# Patient Record
Sex: Male | Born: 1975 | Race: White | Hispanic: No | Smoking: Former smoker
Health system: Southern US, Community
[De-identification: ages and names within clinical notes are randomized; demographics above are authoritative.]

## PROBLEM LIST (undated history)

## (undated) DIAGNOSIS — U071 COVID-19: Secondary | ICD-10-CM

## (undated) DIAGNOSIS — B019 Varicella without complication: Secondary | ICD-10-CM

## (undated) DIAGNOSIS — E079 Disorder of thyroid, unspecified: Secondary | ICD-10-CM

## (undated) DIAGNOSIS — N200 Calculus of kidney: Secondary | ICD-10-CM

## (undated) DIAGNOSIS — G43909 Migraine, unspecified, not intractable, without status migrainosus: Secondary | ICD-10-CM

## (undated) DIAGNOSIS — T7840XA Allergy, unspecified, initial encounter: Secondary | ICD-10-CM

## (undated) HISTORY — DX: Varicella without complication: B01.9

## (undated) HISTORY — DX: Calculus of kidney: N20.0

## (undated) HISTORY — DX: Allergy, unspecified, initial encounter: T78.40XA

## (undated) HISTORY — PX: FRACTURE SURGERY: SHX138

## (undated) HISTORY — PX: SPINE SURGERY: SHX786

## (undated) HISTORY — PX: OTHER SURGICAL HISTORY: SHX169

## (undated) HISTORY — DX: Migraine, unspecified, not intractable, without status migrainosus: G43.909

---

## 1997-12-22 ENCOUNTER — Ambulatory Visit (HOSPITAL_COMMUNITY): Admission: RE | Admit: 1997-12-22 | Discharge: 1997-12-22 | Payer: Self-pay | Admitting: Family Medicine

## 1998-02-22 ENCOUNTER — Encounter: Payer: Self-pay | Admitting: Emergency Medicine

## 1998-02-22 ENCOUNTER — Emergency Department (HOSPITAL_COMMUNITY): Admission: EM | Admit: 1998-02-22 | Discharge: 1998-02-23 | Payer: Self-pay | Admitting: Emergency Medicine

## 1998-02-23 ENCOUNTER — Encounter: Payer: Self-pay | Admitting: Emergency Medicine

## 1998-03-08 ENCOUNTER — Encounter: Payer: Self-pay | Admitting: Neurosurgery

## 1998-03-08 ENCOUNTER — Ambulatory Visit (HOSPITAL_COMMUNITY): Admission: RE | Admit: 1998-03-08 | Discharge: 1998-03-08 | Payer: Self-pay | Admitting: Neurosurgery

## 1999-09-20 ENCOUNTER — Emergency Department (HOSPITAL_COMMUNITY): Admission: EM | Admit: 1999-09-20 | Discharge: 1999-09-20 | Payer: Self-pay | Admitting: Emergency Medicine

## 1999-09-20 ENCOUNTER — Encounter: Payer: Self-pay | Admitting: Emergency Medicine

## 2000-07-18 ENCOUNTER — Emergency Department (HOSPITAL_COMMUNITY): Admission: EM | Admit: 2000-07-18 | Discharge: 2000-07-18 | Payer: Self-pay | Admitting: Emergency Medicine

## 2000-07-18 ENCOUNTER — Encounter: Payer: Self-pay | Admitting: Emergency Medicine

## 2001-04-23 ENCOUNTER — Emergency Department (HOSPITAL_COMMUNITY): Admission: EM | Admit: 2001-04-23 | Discharge: 2001-04-24 | Payer: Self-pay | Admitting: *Deleted

## 2001-04-25 ENCOUNTER — Emergency Department (HOSPITAL_COMMUNITY): Admission: EM | Admit: 2001-04-25 | Discharge: 2001-04-25 | Payer: Self-pay | Admitting: *Deleted

## 2006-05-25 ENCOUNTER — Emergency Department (HOSPITAL_COMMUNITY): Admission: EM | Admit: 2006-05-25 | Discharge: 2006-05-25 | Payer: Self-pay | Admitting: Emergency Medicine

## 2007-02-18 ENCOUNTER — Encounter: Admission: RE | Admit: 2007-02-18 | Discharge: 2007-02-18 | Payer: Self-pay | Admitting: Specialist

## 2007-04-19 ENCOUNTER — Ambulatory Visit: Payer: Self-pay | Admitting: *Deleted

## 2007-04-19 ENCOUNTER — Inpatient Hospital Stay (HOSPITAL_COMMUNITY): Admission: RE | Admit: 2007-04-19 | Discharge: 2007-04-21 | Payer: Self-pay | Admitting: Orthopedic Surgery

## 2007-04-20 ENCOUNTER — Encounter (INDEPENDENT_AMBULATORY_CARE_PROVIDER_SITE_OTHER): Payer: Self-pay | Admitting: Orthopedic Surgery

## 2007-05-27 HISTORY — PX: REPLACEMENT DISC ANTERIOR LUMBAR SPINE: SUR1215

## 2010-05-02 ENCOUNTER — Emergency Department (HOSPITAL_COMMUNITY): Admission: EM | Admit: 2010-05-02 | Discharge: 2010-01-03 | Payer: Self-pay | Admitting: Emergency Medicine

## 2010-10-08 NOTE — Op Note (Signed)
NAMESELDEN, NOTEBOOM              ACCOUNT NO.:  [id number]   MEDICAL RECORD NO.:  [id number]          PATIENT TYPE:  INP   LOCATION:  2899                         FACILITY:  [facility]   PHYSICIAN:  Alvy Beal, MD    DATE OF BIRTH:  1975-09-24   DATE OF PROCEDURE:  04/19/2007  DATE OF DISCHARGE:                               OPERATIVE REPORT   PREOPERATIVE DIAGNOSIS:  Degenerative disk disease, L5-S1, with  diskogenic back pain.   POSTOPERATIVE DIAGNOSIS:  Degenerative disk disease, L5-S1, with  diskogenic back pain.   OPERATIVE PROCEDURE:  Total disk replacement, L5-S1.   COMPLICATIONS:  None.   CONDITION:  Stable.   APPROACH SURGEON:  Liliane Bade, M.D.   FIRST ASSISTANT:  Verda Cumins   HISTORY:  This is a very pleasant 35 year old gentleman who was in a  usual state of health until he started developing progressive back pain.  He came to my attention and was diagnosed with degenerative disk  disease, L5-S1.  Long-term conservative management consisting of  physiotherapy, injection therapy, narcotic and anti-inflammatory  medications had failed to alleviate his symptoms.  Because of the  progressive nature of the debilitation and his functional loss, he  elected to proceed with surgery.  All appropriate risks, benefits and  alternatives were discussed with the patient, and consent was obtained.   OPERATIVE NOTE:  The patient was brought to the operating room and  placed supine on the operating table.  After successful induction of  general anesthesia and endotracheal intubation, TEDS, SCDs and Foley  were applied.  Roll was placed underneath the lumbar spine, and the  anterior abdomen was prepped and draped in standard fashion.  Dr. Madilyn Fireman  then did a right-sided retroperitoneal approach.  Please refer to his  dictation for specifics on the approach.   Once we had the vascular bundle mobilized and exposed the anterior  lumbar spine, I placed a needle into the  L5-S1 disk space and took an x-  ray confirming that this was the appropriate level.  Once confirmed, we  then proceeded with the diskectomy.   Using a 10-blade scalpel, I incised the annulus and then used a Cobb  curette to dissect the disk from the subchondral bone.  I then resected  the disk with a combination of pituitary rongeurs, curettes and Kerrison  rongeurs.  Once we had the disk material out, I was able to take a fine  curette and seek behind the body of S1 and release the annulus and  behind the body of L5 and released the posterior annulus.  I then took a  2-mm Kerrison rongeur and resected the posterior annulus.  At this  point, I had excellent decompression posteriorly.  I was able to get a  small curette out into the lateral aspect of the disk space, ensuring  that there was no undue pressure against the underlying exiting nerve  roots behind it.   At this point with the diskectomy complete and removing all fragments of  disk material, we then measured the interbody space and decided to use a  large size 10/11  degree lordosis prosthesis.  We confirmed its proper  position in the AP and lateral planes, ensuring it was centered.  Once  we were ensured of this, I then used a chisel to make fin cut on L5-S1.  With the fin properly cut, I then obtained the implant and mounted it  into position.  Once I confirmed I was at the adequate depth just at the  posterior vertebral body, I then placed the appropriate sized poly and  mounted it into position.  I removed the inserting apparatus and  confirmed that the poly was flush against the insert.  At this point, we  had satisfactory implantation.  I irrigated the wound copiously with  normal saline and then removed the self-retaining Thompson retractors.  Dr. Madilyn Fireman then scrubbed in at this point in time.  He inspected the  wound to ensure adequate hemostasis and then closed the rectus fascia  with interrupted #1 Vicryl sutures,  superficial with 2-0 Vicryl sutures  and 3-0 Monocryl for the skin.  Steri-Strips and dry dressing were  applied.  The patient was extubated and transferred to the PACU without  incident.  At the end of the case, all needle and sponge counts were  correct.      Alvy Beal, MD  Electronically Signed     DDB/MEDQ  D:  04/19/2007  T:  04/19/2007  Job:  [id number]   cc:   Alvy Beal, MD  P. Liliane Bade, M.D.

## 2010-10-08 NOTE — Op Note (Signed)
Curtis Cox, Curtis Cox              ACCOUNT NO.:  [id number]   MEDICAL RECORD NO.:  [id number]          PATIENT TYPE:  INP   LOCATION:  2550                         FACILITY:  [facility]   PHYSICIAN:  Balinda Quails, M.D.    DATE OF BIRTH:  15-Nov-1975   DATE OF PROCEDURE:  04/19/2007  DATE OF DISCHARGE:                               OPERATIVE REPORT   SURGEON:  Denman George, MD.   Mammie LorenzoDebria Garret D. Shon Baton, MD.   ANESTHETIC:  General endotracheal.   PREOPERATIVE DIAGNOSIS:  L5-S1 degenerative disk disease.   POSTOPERATIVE DIAGNOSIS:  L5-S1 degenerative disk disease.   PROCEDURE:  L5-S1 artificial disk (Prodisc).   CLINICAL NOTE:  Curtis Cox is a 35 year old male with chronic back  pain and degenerative disease at L5-S1.  Seen in consultation by Dr.  Shon Baton,  felt to be a good candidate for placement of a Prodisc. Brought  to the operating room at this time for planned procedure.  Consent  obtained.  The patient's lower extremity pulses normal.  The potential  risks of the operative procedure were explained to the patient with a  major morbidity/mortality of 1-2%.   DESCRIPTION OF PROCEDURE:  The patient was brought to the operating room  in stable condition.  Placed in supine position.  General endotracheal  anesthesia induced.  Pulse oximetry placed on the left foot.  In the  supine position, the abdomen prepped and draped in a sterile fashion.   A transverse skin incision made in the right lower quadrant from midline  to lateral margin of rectus.  Dissection carried down through the  subcutaneous tissue. The anterior rectus sheath incised from midline to  the lateral margin of right rectus muscle.  The superior and inferior  rectus sheath flaps were created.  The rectus muscle bluntly mobilized.  The muscle initially retracted medially. The retroperitoneal plane  entered.  There were some small rents made in the peritoneum, these were  not significant.  The  retroperitoneal space entered the psoas muscle and  genitofemoral nerve identified.  The nerve left on the muscle.   The ureter and sympathetic fibers were swept off of the right common and  external iliac artery.  The L5-S1 disk space was easily palpated.  This  was bluntly cleared initially.  The prefascial plane then incised with  Bovie cautery.  The L5-S1 disk exposed from right to left.  The lateral  margins of the L5 and S1 disk bodies were cleared.   Using a [employer] retractor with a reverse blade, the blades were  placed on the lateral margins of the vertebral bodies bilaterally.  This  fully expose the L5-S1 disk.  Malleable retractors were used to retract  inferiorly and superiorly.   Dr. Shon Baton then verified the level and completed the Prodisc procedure.   After completion of the Prodisc procedure, the retractor was removed.  There was no significant bleeding.  Sponges and instrument counts  correct.   The anterior rectus sheath closed with running #Vicryl suture.  The  subcutaneous tissue closed with running 3-0 Vicryl suture.  Skin closed  with 4-0 Monocryl.  Dermabond and Steri-Strips applied.  A sterile  dressing applied.  No apparent complications.  The patient was  transferred to the recovery room in stable condition.      Balinda Quails, M.D.  Electronically Signed     PGH/MEDQ  D:  04/19/2007  T:  04/19/2007  Job:  [id number]   cc:   Alvy Beal, MD

## 2010-10-11 NOTE — Consult Note (Signed)
Curtis Cox, CASTROGIOVANNI              ACCOUNT NO.:  [id number]   MEDICAL RECORD NO.:  [id number]          PATIENT TYPE:  INP   LOCATION:  5022                         FACILITY:  [facility]   PHYSICIAN:  Alvy Beal, MD    DATE OF BIRTH:  Apr 23, 1976   DATE OF CONSULTATION:  DATE OF DISCHARGE:  04/21/2007                                 CONSULTATION   REASON FOR ADMISSION:  Degenerative disk disease, lumbar five-sacral  one.   DISCHARGE DIAGNOSIS:  Degenerative disk disease, lumbar five-sacral one.   HISTORY:  This is a very pleasant 35 year old gentleman who has had  longstanding significant low back pain.  He was diagnosed with mild  degenerative disk disease at L5-S1 confirmed by MRI and discography.  Attempts at conservative management had failed to alleviate his symptoms  so he elected to proceed with a disk replacement.  All appropriate  risks, benefits, and alternatives were discussed with the patient.  Consent was obtained.  On the day of admission, he was admitted for the  disk replacement.  Surgery was uneventful.  There were no significant  adverse intraoperative occurrences.  Postoperatively he was admitted to  the floor.  He was ambulating, tolerating a regular diet and voiding  spontaneously.  Postoperative Doppler was unremarkable.  Postoperative  CT scan was satisfactory with no significant adverse positioning of the  hardware.  As a result, he was discharged on April 21, 2007,  postoperative day number 2, to home, with appropriate preprinted  instructions, pain medications and followup with me in approximately 2  weeks.      Alvy Beal, MD  Electronically Signed     DDB/MEDQ  D:  06/03/2007  T:  06/03/2007  Job:  [id number]

## 2011-03-04 LAB — TYPE AND SCREEN
ABO/RH(D): A POS
Antibody Screen: NEGATIVE

## 2011-03-04 LAB — BASIC METABOLIC PANEL
BUN: 12
CO2: 30
Chloride: 101
GFR calc Af Amer: >60
GFR calc non Af Amer: >60
Potassium: 4
Sodium: 135

## 2011-03-04 LAB — ABO/RH: ABO/RH(D): A POS

## 2011-03-04 LAB — CBC
HCT: 43.2
MCHC: 34.2
MCHC: 34.7
RDW: 13.7
WBC: 7.4
WBC: 9.8

## 2011-05-05 ENCOUNTER — Ambulatory Visit (INDEPENDENT_AMBULATORY_CARE_PROVIDER_SITE_OTHER): Payer: 59 | Admitting: Sports Medicine

## 2011-05-05 VITALS — BP 120/80 | Ht 68.0 in | Wt 165.0 lb

## 2011-05-05 DIAGNOSIS — M722 Plantar fascial fibromatosis: Secondary | ICD-10-CM

## 2011-05-05 NOTE — Progress Notes (Signed)
  Subjective:    Patient ID: Curtis Cox, male    DOB: Aug 26, 1975, 35 y.o.   MRN: [id number]  HPI 35 y/o male is here complaining of bilateral heel pain and arch pain x2 months, worse in the morning and eases off somewhat after walking around.  He does wear a rigid 3/4 insert which he purchased from the [employer] store 2 years ago when he had knee pain.  He no longer has knee pain but has developed this foot pain.  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     Review of Systems     Objective:   Physical Exam  Tender to palpation at the insertion of the plantar fascia bilaterally Arches are preserved Normal posterior tibialis function No achilles tenderness to palpation  1cm healing wound on the right lower leg, no signs of infection  Ultrasound: Left plantar fascia is normal at the insertion but has a 0.41 cm thickening just distal to the insertion.  At this point there is a small fluid collection and calcification noted.  The left plantar fascia measures 0.41 at the insertion.  The plantar fascia appears normal distally bilaterally.       Assessment & Plan:

## 2011-05-05 NOTE — Patient Instructions (Signed)
1. You have plantar fasciitis.  It will be helped by wearing your insoles and arch supports daily.    2. You should do your stretching once or twice a day.  3. Do your exercises daily  4. Use ice at the end of the day  5. Follow up with [place] in 2 months.

## 2011-05-07 DIAGNOSIS — M722 Plantar fascial fibromatosis: Secondary | ICD-10-CM | POA: Insufficient documentation

## 2011-05-07 NOTE — Assessment & Plan Note (Addendum)
He will wear sports insoles with scaphoid support and also an arch strap on the left foot.  He will do plantar fascia exercises and ice at the end of the day.  Re ck 6 wks

## 2016-02-12 ENCOUNTER — Ambulatory Visit (INDEPENDENT_AMBULATORY_CARE_PROVIDER_SITE_OTHER): Payer: 59 | Admitting: Family Medicine

## 2016-02-12 ENCOUNTER — Encounter: Payer: Self-pay | Admitting: Family Medicine

## 2016-02-12 VITALS — BP 112/88 | HR 69 | Temp 97.6°F | Ht 68.75 in | Wt 182.8 lb

## 2016-02-12 DIAGNOSIS — N200 Calculus of kidney: Secondary | ICD-10-CM

## 2016-02-12 DIAGNOSIS — R309 Painful micturition, unspecified: Secondary | ICD-10-CM | POA: Diagnosis not present

## 2016-02-12 DIAGNOSIS — M545 Low back pain, unspecified: Secondary | ICD-10-CM

## 2016-02-12 LAB — POC URINALSYSI DIPSTICK (AUTOMATED)
BILIRUBIN UA: NEGATIVE
Blood, UA: NEGATIVE
GLUCOSE UA: NEGATIVE
Ketones, UA: NEGATIVE
Leukocytes, UA: NEGATIVE
Nitrite, UA: NEGATIVE
Protein, UA: NEGATIVE
SPEC GRAV UA: 1.015
UROBILINOGEN UA: 0.2
pH, UA: 7

## 2016-02-12 MED ORDER — TAMSULOSIN HCL 0.4 MG PO CAPS: 0.4000 mg | ORAL_CAPSULE | Freq: Every day | ORAL | 0 refills | Status: DC

## 2016-02-12 MED ORDER — OXYCODONE-ACETAMINOPHEN 5-325 MG PO TABS: 1.0000 | ORAL_TABLET | ORAL | 0 refills | Status: DC | PRN

## 2016-02-12 MED ORDER — KETOROLAC TROMETHAMINE 60 MG/2ML IM SOLN
60.0000 mg | Freq: Once | INTRAMUSCULAR | Status: AC
  Administered 2016-02-12: 60 mg via INTRAMUSCULAR

## 2016-02-12 NOTE — Progress Notes (Signed)
Pre visit review using our clinic review tool, if applicable. No additional management support is needed unless otherwise documented below in the visit note. 

## 2016-02-12 NOTE — Progress Notes (Addendum)
Phone: [phone]  Subjective:  Patient presents today to establish care. Chief complaint-noted.   See problem oriented charting  The following were reviewed and entered/updated in epic: Past Medical History:  Diagnosis Date  . Chicken pox   . Kidney stones   . Migraines   . Nephrolithiasis    Patient Active Problem List   Diagnosis Date Noted  . Nephrolithiasis   . Plantar fasciitis 05/07/2011   Past Surgical History:  Procedure Laterality Date  . gamekeepers thumb surgery    . REPLACEMENT DISC ANTERIOR LUMBAR SPINE  2009   L5-S1    Family History  Problem Relation Age of Onset  . Healthy Mother   . Nephrolithiasis Father   . Healthy Sister   . Healthy Brother     Medications- reviewed and updated Current Outpatient Prescriptions  Medication Sig Dispense Refill  . oxyCODONE-acetaminophen (PERCOCET/ROXICET) 5-325 MG tablet Take 1 tablet by mouth every 4 (four) hours as needed for severe pain. 30 tablet 0  . tamsulosin (FLOMAX) 0.4 MG CAPS capsule Take 1 capsule (0.4 mg total) by mouth daily. 30 capsule 0   No current facility-administered medications for this visit.     Allergies-reviewed and updated Allergies  Allergen Reactions  . Penicillins     Social History   Social History  . Marital status: Married    Spouse name: N/A  . Number of children: N/A  . Years of education: N/A   Social History Main Topics  . Smoking status: Former Smoker    Packs/day: 0.75    Years: 20.00    Types: Cigarettes    Quit date: 05/27/2015  . Smokeless tobacco: Never Used  . Alcohol use Yes     Comment: 2-3 per week  . Drug use: Unknown  . Sexual activity: Not Asked   Other Topics Concern  . None   Social History Narrative   Married. 1 son 10 in 2017.       Works at [employer]. HS education    ROS--Full ROS was completed Review of Systems  Constitutional: Negative for chills and fever.  HENT: Negative for hearing loss.   Eyes: Negative for blurred  vision and double vision.  Respiratory: Negative for cough and hemoptysis.   Cardiovascular: Negative for chest pain and palpitations.  Gastrointestinal: Negative for abdominal pain, heartburn, nausea (mild) and vomiting.  Genitourinary: Positive for flank pain. Negative for dysuria, hematuria and urgency.  Musculoskeletal: Positive for myalgias (right flank pain). Negative for neck pain.  Skin: Negative for itching and rash.  Neurological: Negative for dizziness, tingling and headaches.  Endo/Heme/Allergies: Negative for polydipsia. Does not bruise/bleed easily.  Psychiatric/Behavioral: Negative for hallucinations and substance abuse. The patient is not nervous/anxious.     Objective: BP 112/88 (BP Location: Left Arm, Patient Position: Sitting, Cuff Size: Normal)   Pulse 69   Temp 97.6 F (36.4 C) (Oral)   Ht 5' 8.75" (1.746 m)   Wt 182 lb 12.8 oz (82.9 kg)   SpO2 97%   BMI 27.19 kg/m  Gen: NAD, resting comfortably HEENT: Mucous membranes are moist. Oropharynx normal. TM normal. Eyes: sclera and lids normal, PERRLA Neck: no thyromegaly, no cervical lymphadenopathy CV: RRR no murmurs rubs or gallops Lungs: CTAB no crackles, wheeze, rhonchi Abdomen: soft/nontender/nondistended/normal bowel sounds. No rebound or guarding.  MSK: patient tender to even light touch in right CVA- no tenderness on left Ext: no edema Skin: warm, dry Neuro: 5/5 strength in upper and lower extremities, normal gait, normal reflexes  Results  for orders placed or performed in visit on 02/12/16 (from the past 48 hour(s))  POCT Urinalysis Dipstick (Automated)     Status: None   Collection Time: 02/12/16 12:37 PM  Result Value Ref Range   Color, UA yellow    Clarity, UA clear    Glucose, UA n    Bilirubin, UA n    Ketones, UA n    Spec Grav, UA 1.015    Blood, UA n    pH, UA 7.0    Protein, UA n    Urobilinogen, UA 0.2    Nitrite, UA n    Leukocytes, UA Negative Negative     Assessment/Plan:  Urinary pain - Plan: POCT Urinalysis Dipstick (Automated) Right-sided low back pain without sciatica - Plan: ketorolac (TORADOL) injection 60 mg S: Patient with history of multiple kidney stones with epic noting stone 1-2 mm in 2001 and 2 mm in 2008 but he believes he has had several more. Has never seen urology as always passes the stone. Rates pain as 4-5/10 at lowest to waves of pain 8/10 like someone is squeezing his back. Has tried some ibuprofen and helps some. He has been going to work but is in serious discomfort. No radiatoin of pain to groin.  A/P: Very strong probability of right sided kidney stone- other stones have been small enough to pass on their own. UA unimpressive but does not rule out stone. We will give flomax. Given toradol 60mg  in office. Percocet to use as needed at home. We discussed if not having passed by Friday or Monday or worsening symptoms to get CT stone study  Physical later this year- as needed for current issue  Orders Placed This Encounter  Procedures  . POCT Urinalysis Dipstick (Automated)    Meds ordered this encounter  Medications  . ketorolac (TORADOL) injection 60 mg  . tamsulosin (FLOMAX) 0.4 MG CAPS capsule    Sig: Take 1 capsule (0.4 mg total) by mouth daily.    Dispense:  30 capsule    Refill:  0  . oxyCODONE-acetaminophen (PERCOCET/ROXICET) 5-325 MG tablet    Sig: Take 1 tablet by mouth every 4 (four) hours as needed for severe pain.    Dispense:  30 tablet    Refill:  0    Return precautions advised.  Garret Reddish, MD

## 2016-02-12 NOTE — Patient Instructions (Signed)
Starting tonight schedule aleve every 12 hours to give you a baseline level of pain control  Percocet as needed for pain control above that  Start flomax to help you pass stone- strain urine so we can tell if passing  If stone has not passed by Friday or Monday- we will get CT scan- can message [place] or call or contact Roselyn Reef

## 2016-02-13 ENCOUNTER — Encounter: Payer: Self-pay | Admitting: Family Medicine

## 2016-02-13 DIAGNOSIS — N200 Calculus of kidney: Secondary | ICD-10-CM | POA: Insufficient documentation

## 2016-02-15 ENCOUNTER — Ambulatory Visit (INDEPENDENT_AMBULATORY_CARE_PROVIDER_SITE_OTHER)
Admission: RE | Admit: 2016-02-15 | Discharge: 2016-02-15 | Disposition: A | Discharge status: Home / Self Care | Payer: 59 | Source: Ambulatory Visit | Attending: Family Medicine | Admitting: Family Medicine

## 2016-02-15 ENCOUNTER — Ambulatory Visit (INDEPENDENT_AMBULATORY_CARE_PROVIDER_SITE_OTHER): Payer: 59 | Admitting: *Deleted

## 2016-02-15 ENCOUNTER — Telehealth: Payer: Self-pay | Admitting: Family Medicine

## 2016-02-15 DIAGNOSIS — N200 Calculus of kidney: Secondary | ICD-10-CM | POA: Diagnosis not present

## 2016-02-15 MED ORDER — KETOROLAC TROMETHAMINE 60 MG/2ML IM SOLN
60.0000 mg | Freq: Once | INTRAMUSCULAR | Status: AC
  Administered 2016-02-15: 60 mg via INTRAMUSCULAR

## 2016-02-15 NOTE — Telephone Encounter (Signed)
Spoke to pt, told him order for scan was done and someone will be contacting him to schedule appt. Pt verbalized understanding. Also asked pt if he wants to see Dr.Hunter or just wants another injection? Pt said just injection. Told pt okay can come in at 11:45 for injection only. Pt verbalized understanding.

## 2016-02-15 NOTE — Telephone Encounter (Signed)
Patient wants to set up CT scan and get injection with continued pain from stone. Ordered scan. He also may want toradol shot- Butch Penny- can you see if he wants to see me or just get on injectoin schedule? Toradol 60mg  IM  Would like to get scan done today. ordered

## 2016-02-18 ENCOUNTER — Ambulatory Visit (INDEPENDENT_AMBULATORY_CARE_PROVIDER_SITE_OTHER): Payer: 59 | Admitting: Family Medicine

## 2016-02-18 ENCOUNTER — Encounter: Payer: Self-pay | Admitting: Family Medicine

## 2016-02-18 VITALS — BP 128/102 | HR 78 | Temp 97.9°F | Wt 184.8 lb

## 2016-02-18 DIAGNOSIS — M545 Low back pain, unspecified: Secondary | ICD-10-CM

## 2016-02-18 DIAGNOSIS — R5383 Other fatigue: Secondary | ICD-10-CM | POA: Diagnosis not present

## 2016-02-18 DIAGNOSIS — R6883 Chills (without fever): Secondary | ICD-10-CM | POA: Diagnosis not present

## 2016-02-18 LAB — COMPREHENSIVE METABOLIC PANEL
ALT: 25 U/L (ref 0–53)
AST: 28 U/L (ref 0–37)
Albumin: 4.6 g/dL (ref 3.5–5.2)
Alkaline Phosphatase: 56 U/L (ref 39–117)
BILIRUBIN TOTAL: 0.5 mg/dL (ref 0.2–1.2)
BUN: 17 mg/dL (ref 6–23)
CALCIUM: 9.4 mg/dL (ref 8.4–10.5)
CHLORIDE: 104 meq/L (ref 96–112)
CO2: 28 meq/L (ref 19–32)
CREATININE: 1.29 mg/dL (ref 0.40–1.50)
GFR: 65.22 mL/min (ref >60.00)
GLUCOSE: 69 mg/dL — AB (ref 70–99)
Potassium: 3.9 mEq/L (ref 3.5–5.1)
Sodium: 141 mEq/L (ref 135–145)
Total Protein: 8.2 g/dL (ref 6.0–8.3)

## 2016-02-18 LAB — CBC WITH DIFFERENTIAL/PLATELET
BASOS ABS: 0 10*3/uL (ref 0.0–0.1)
BASOS PCT: 0.5 % (ref 0.0–3.0)
EOS ABS: 0.1 10*3/uL (ref 0.0–0.7)
Eosinophils Relative: 1.7 % (ref 0.0–5.0)
HEMATOCRIT: 44.3 % (ref 39.0–52.0)
Hemoglobin: 15.4 g/dL (ref 13.0–17.0)
LYMPHS ABS: 1.7 10*3/uL (ref 0.7–4.0)
Lymphocytes Relative: 25.9 % (ref 12.0–46.0)
MCHC: 34.7 g/dL (ref 30.0–36.0)
MCV: 86.2 fl (ref 78.0–100.0)
Monocytes Absolute: 0.4 10*3/uL (ref 0.1–1.0)
Monocytes Relative: 6.8 % (ref 3.0–12.0)
NEUTROS ABS: 4.3 10*3/uL (ref 1.4–7.7)
NEUTROS PCT: 65.1 % (ref 43.0–77.0)
PLATELETS: 345 10*3/uL (ref 150.0–400.0)
RBC: 5.13 Mil/uL (ref 4.22–5.81)
RDW: 13 % (ref 11.5–15.5)
WBC: 6.6 10*3/uL (ref 4.0–10.5)

## 2016-02-18 LAB — POC URINALSYSI DIPSTICK (AUTOMATED)
Bilirubin, UA: NEGATIVE
Blood, UA: NEGATIVE
GLUCOSE UA: NEGATIVE
Ketones, UA: NEGATIVE
LEUKOCYTES UA: NEGATIVE
Nitrite, UA: NEGATIVE
PROTEIN UA: NEGATIVE
SPEC GRAV UA: 1.01
UROBILINOGEN UA: 0.2
pH, UA: 5.5

## 2016-02-18 LAB — TSH: TSH: 49.25 u[IU]/mL — ABNORMAL HIGH (ref 0.35–4.50)

## 2016-02-18 NOTE — Progress Notes (Signed)
Pre visit review using our clinic review tool, if applicable. No additional management support is needed unless otherwise documented below in the visit note. 

## 2016-02-18 NOTE — Patient Instructions (Addendum)
Labs before you go.   I suspect this is a virus and wouldn't be surprised if you develop fever, more diffuse body aches, cough, congestion  I would go home- get lots of rest, stay hydrated, tylenol as needed for the body and back aches. If your symptoms worsen- I want you to come back in ASAP. Let me know if you neck stiffness or fevers as well  Stop flomax

## 2016-02-18 NOTE — Progress Notes (Signed)
Subjective:  Curtis Cox is a 40 y.o. year old very pleasant male patient who presents for/with See problem oriented charting ROS- no measured fever. Mild headache at times. No nausea or vomiting. No abdominal pain. No dysuria or polyuria or penile discharge .see any ROS included in HPI as well.   Past Medical History-  Patient Active Problem List   Diagnosis Date Noted  . Nephrolithiasis   . Plantar fasciitis 05/07/2011    Medications- reviewed and updated Current Outpatient Prescriptions  Medication Sig Dispense Refill  . oxyCODONE-acetaminophen (PERCOCET/ROXICET) 5-325 MG tablet Take 1 tablet by mouth every 4 (four) hours as needed for severe pain. 30 tablet 0   No current facility-administered medications for this visit.     Objective: BP (!) 128/102 (BP Location: Left Arm, Patient Position: Sitting, Cuff Size: Normal)   Pulse 78   Temp 97.9 F (36.6 C) (Oral)   Wt 184 lb 12.8 oz (83.8 kg)   SpO2 98%   BMI 27.49 kg/m  Gen: NAD, appears very fatigued. Wants to just lay down after exam TM normal, nares normal, oropharynx normal CV: RRR no murmurs rubs or gallops Lungs: CTAB no crackles, wheeze, rhonchi Abdomen: soft/nontender/nondistended/normal bowel sounds. No rebound or guarding.  Ext: no edema Skin: warm, dry Neuro: grossly normal, moves all extremities  Assessment/Plan:  Other fatigue - Plan: CBC with Differential/Platelet, Comprehensive metabolic panel, TSH Chills - Plan: CBC with Differential/Platelet, Comprehensive metabolic panel Bilateral low back pain without sciatica - Plan: POCT Urinalysis Dipstick (Automated), Urine culture  S: Patient was treated last week for potential kidney stones. CT did not show stones but patient could have been having ureteral spasms after passage of stone (as reported pain even after CT scan and given toradol shot). He states after that point he has had minimal right CVA tenderness and previously up to 8/10 reports as 1/10.  But since Saturday he has felt very fatigued. He has periods of feeling very childled and going back and forth between hot and cold. He has a 1/10 ache in bilateral low back which he describes as kidneys aching- he is worried about potential infection A/P: 40 year old male with fatigue,chills and bilateral low back pain of unknown etiology- after CT noncontrast did not show cause for prior heavy level pain- may have passed stone.   Flu swab today negative. No nuchal rigidity doubt meningitis. Suspect this could be nonspecific viral illness. No dysuria or polyuria- did not do rectal as doubt prostatitis as cause. Considered referral back to ortho with prior back surgery but will hold off for now as back pain not primary issue. Strict return precautions given. Will get UA and culture but doubt that is the cause. Also get cbc and cmp.  Has already stopped flomax as of Friday. No recent tick bites.   Orders Placed This Encounter  Procedures  . Urine culture    [facility]  . CBC with Differential/Platelet  . Comprehensive metabolic panel    [place]  . TSH    [employer]  . POCT Urinalysis Dipstick (Automated)   Return precautions advised.  Garret Reddish, MD

## 2016-02-19 ENCOUNTER — Other Ambulatory Visit: Payer: Self-pay

## 2016-02-19 DIAGNOSIS — R7989 Other specified abnormal findings of blood chemistry: Secondary | ICD-10-CM

## 2016-02-20 ENCOUNTER — Other Ambulatory Visit (INDEPENDENT_AMBULATORY_CARE_PROVIDER_SITE_OTHER): Payer: 59

## 2016-02-20 DIAGNOSIS — R946 Abnormal results of thyroid function studies: Secondary | ICD-10-CM | POA: Diagnosis not present

## 2016-02-20 DIAGNOSIS — R7989 Other specified abnormal findings of blood chemistry: Secondary | ICD-10-CM

## 2016-02-20 LAB — TSH: TSH: 45.96 u[IU]/mL — ABNORMAL HIGH (ref 0.35–4.50)

## 2016-02-20 LAB — URINE CULTURE: ORGANISM ID, BACTERIA: NO GROWTH

## 2016-02-20 LAB — T3, FREE: T3 FREE: 3.4 pg/mL (ref 2.3–4.2)

## 2016-02-20 LAB — T4, FREE: Free T4: 0.43 ng/dL — ABNORMAL LOW (ref 0.60–1.60)

## 2016-02-21 ENCOUNTER — Other Ambulatory Visit: Payer: Self-pay

## 2016-02-21 DIAGNOSIS — R7989 Other specified abnormal findings of blood chemistry: Secondary | ICD-10-CM

## 2016-02-21 MED ORDER — LEVOTHYROXINE SODIUM 88 MCG PO TABS: 88.0000 ug | ORAL_TABLET | Freq: Every day | ORAL | 5 refills | Status: DC

## 2016-03-27 ENCOUNTER — Telehealth: Payer: Self-pay

## 2016-03-27 ENCOUNTER — Other Ambulatory Visit: Payer: Self-pay

## 2016-03-27 DIAGNOSIS — R7989 Other specified abnormal findings of blood chemistry: Secondary | ICD-10-CM

## 2016-03-27 NOTE — Telephone Encounter (Signed)
Patient called asking to have levels for thyroid checked. Patient is experiencing mood swings and cold insensitivity. Patient was originally scheduled to have levels checked in December.  Please advise

## 2016-03-27 NOTE — Telephone Encounter (Signed)
Yes thanks- TSH ok to check at this time- supect may need higher dose

## 2016-03-27 NOTE — Telephone Encounter (Signed)
Spoke with patient. Scheduled for a lab appointment tomorrow and order entered.

## 2016-03-28 ENCOUNTER — Other Ambulatory Visit (INDEPENDENT_AMBULATORY_CARE_PROVIDER_SITE_OTHER): Payer: 59

## 2016-03-28 ENCOUNTER — Other Ambulatory Visit: Payer: Self-pay | Admitting: Family Medicine

## 2016-03-28 DIAGNOSIS — R7989 Other specified abnormal findings of blood chemistry: Secondary | ICD-10-CM

## 2016-03-28 DIAGNOSIS — R946 Abnormal results of thyroid function studies: Secondary | ICD-10-CM

## 2016-03-28 LAB — T4, FREE: Free T4: 0.72 ng/dL (ref 0.60–1.60)

## 2016-03-28 LAB — T3, FREE: T3, Free: 4.5 pg/mL — ABNORMAL HIGH (ref 2.3–4.2)

## 2016-03-28 LAB — TSH: TSH: 7.08 u[IU]/mL — ABNORMAL HIGH (ref 0.35–4.50)

## 2016-03-28 MED ORDER — LEVOTHYROXINE SODIUM 100 MCG PO TABS: 100.0000 ug | ORAL_TABLET | Freq: Every day | ORAL | 5 refills | Status: DC

## 2016-04-04 ENCOUNTER — Other Ambulatory Visit: Payer: 59

## 2016-05-02 ENCOUNTER — Other Ambulatory Visit (INDEPENDENT_AMBULATORY_CARE_PROVIDER_SITE_OTHER): Payer: 59

## 2016-05-02 DIAGNOSIS — R946 Abnormal results of thyroid function studies: Secondary | ICD-10-CM

## 2016-05-02 DIAGNOSIS — R7989 Other specified abnormal findings of blood chemistry: Secondary | ICD-10-CM

## 2016-05-02 LAB — TSH: TSH: 3.94 u[IU]/mL (ref 0.35–4.50)

## 2016-05-08 ENCOUNTER — Encounter: Payer: Self-pay | Admitting: Family Medicine

## 2016-05-08 ENCOUNTER — Ambulatory Visit (INDEPENDENT_AMBULATORY_CARE_PROVIDER_SITE_OTHER): Payer: 59 | Admitting: Family Medicine

## 2016-05-08 VITALS — BP 92/68 | HR 75 | Temp 97.9°F | Ht 68.5 in | Wt 181.8 lb

## 2016-05-08 DIAGNOSIS — E039 Hypothyroidism, unspecified: Secondary | ICD-10-CM | POA: Diagnosis not present

## 2016-05-08 DIAGNOSIS — Z23 Encounter for immunization: Secondary | ICD-10-CM | POA: Diagnosis not present

## 2016-05-08 DIAGNOSIS — Z7251 High risk heterosexual behavior: Secondary | ICD-10-CM | POA: Diagnosis not present

## 2016-05-08 DIAGNOSIS — Z0000 Encounter for general adult medical examination without abnormal findings: Secondary | ICD-10-CM | POA: Diagnosis not present

## 2016-05-08 DIAGNOSIS — Z114 Encounter for screening for human immunodeficiency virus [HIV]: Secondary | ICD-10-CM

## 2016-05-08 LAB — LIPID PANEL
Cholesterol: 195 mg/dL (ref 0–200)
HDL: 40 mg/dL (ref >39.00)
NonHDL: 155.46
Total CHOL/HDL Ratio: 5
Triglycerides: 203 mg/dL — ABNORMAL HIGH (ref 0.0–149.0)
VLDL: 40.6 mg/dL — ABNORMAL HIGH (ref 0.0–40.0)

## 2016-05-08 LAB — POC URINALSYSI DIPSTICK (AUTOMATED)
BILIRUBIN UA: NEGATIVE
Blood, UA: NEGATIVE
GLUCOSE UA: NEGATIVE
Ketones, UA: NEGATIVE
LEUKOCYTES UA: NEGATIVE
NITRITE UA: NEGATIVE
Spec Grav, UA: 1.025
Urobilinogen, UA: 0.2
pH, UA: 5

## 2016-05-08 LAB — COMPREHENSIVE METABOLIC PANEL
ALBUMIN: 5.2 g/dL (ref 3.5–5.2)
ALK PHOS: 66 U/L (ref 39–117)
ALT: 20 U/L (ref 0–53)
AST: 24 U/L (ref 0–37)
BUN: 20 mg/dL (ref 6–23)
CO2: 27 mEq/L (ref 19–32)
Calcium: 9.9 mg/dL (ref 8.4–10.5)
Chloride: 105 mEq/L (ref 96–112)
Creatinine, Ser: 1.15 mg/dL (ref 0.40–1.50)
GFR: 74.38 mL/min (ref >60.00)
GLUCOSE: 81 mg/dL (ref 70–99)
Potassium: 4.2 mEq/L (ref 3.5–5.1)
Sodium: 140 mEq/L (ref 135–145)
TOTAL PROTEIN: 7.8 g/dL (ref 6.0–8.3)
Total Bilirubin: 0.6 mg/dL (ref 0.2–1.2)

## 2016-05-08 LAB — TSH: TSH: 1.47 u[IU]/mL (ref 0.35–4.50)

## 2016-05-08 LAB — LDL CHOLESTEROL, DIRECT: Direct LDL: 121 mg/dL

## 2016-05-08 NOTE — Progress Notes (Signed)
Pre visit review using our clinic review tool, if applicable. No additional management support is needed unless otherwise documented below in the visit note. 

## 2016-05-08 NOTE — Assessment & Plan Note (Signed)
Now controlled on Levothyroxine 125mcg. Initial tsh 50

## 2016-05-08 NOTE — Progress Notes (Signed)
Phone: [phone]  Subjective:  Patient presents today for their annual physical. Chief complaint-noted.   See problem oriented charting- ROS- full  review of systems was completed and negative except for: mild fatigue but continuing to improve. Has some chronic low back pain  The following were reviewed and entered/updated in epic: Past Medical History:  Diagnosis Date  . Chicken pox   . Kidney stones   . Migraines   . Nephrolithiasis    Patient Active Problem List   Diagnosis Date Noted  . Hypothyroidism 05/08/2016    Priority: Medium  . Nephrolithiasis     Priority: Low  . Plantar fasciitis 05/07/2011    Priority: Low   Past Surgical History:  Procedure Laterality Date  . gamekeepers thumb surgery    . REPLACEMENT DISC ANTERIOR LUMBAR SPINE  2009   L5-S1    Family History  Problem Relation Age of Onset  . Healthy Mother   . Nephrolithiasis Father   . Healthy Sister   . Healthy Brother     Medications- reviewed and updated Current Outpatient Prescriptions  Medication Sig Dispense Refill  . levothyroxine (SYNTHROID, LEVOTHROID) 100 MCG tablet Take 1 tablet (100 mcg total) by mouth daily before breakfast. 30 tablet 5  . oxyCODONE-acetaminophen (PERCOCET/ROXICET) 5-325 MG tablet- on hand for kidney stone Take 1 tablet by mouth every 4 (four) hours as needed for severe pain. 30 tablet 0   Allergies-reviewed and updated Allergies  Allergen Reactions  . Penicillins     Social History   Social History  . Marital status: Married    Spouse name: N/A  . Number of children: N/A  . Years of education: N/A   Social History Main Topics  . Smoking status: Former Smoker    Packs/day: 0.75    Years: 20.00    Types: Cigarettes    Quit date: 05/27/2015  . Smokeless tobacco: Never Used  . Alcohol use Yes     Comment: 2-3 per week  . Drug use: Unknown  . Sexual activity: Not Asked   Other Topics Concern  . None   Social History Narrative   Married. 1 son 10  in 2017.       Works at [employer]. HS education   Prior in [place].     Objective: BP 92/68 (BP Location: Left Arm, Patient Position: Sitting, Cuff Size: Large)   Pulse 75   Temp 97.9 F (36.6 C) (Oral)   Ht 5' 8.5" (1.74 m)   Wt 181 lb 12.8 oz (82.5 kg)   SpO2 94%   BMI 27.24 kg/m  Gen: NAD, resting comfortably HEENT: Mucous membranes are moist. Oropharynx normal Neck: no thyromegaly CV: RRR no murmurs rubs or gallops Lungs: CTAB no crackles, wheeze, rhonchi Abdomen: soft/nontender/nondistended/normal bowel sounds. No rebound or guarding.  Ext: no edema Skin: warm, dry Neuro: grossly normal, moves all extremities, PERRLA  Assessment/Plan:  40 y.o. male presenting for annual physical.  Health Maintenance counseling: 1. Anticipatory guidance: Patient counseled regarding regular dental exams, eye exams (no issues), wearing seatbelts.  2. Risk factor reduction:  Advised patient of need for regular exercise and diet rich and fruits and vegetables to reduce risk of heart attack and stroke. Hypothyroidism made it difficult but recently he has done some workouts at night with son. Goal 150 minutes a week. 165 in 2012- work towards this goal Wt Readings from Last 3 Encounters:  05/08/16 181 lb 12.8 oz (82.5 kg)  02/18/16 184 lb 12.8 oz (83.8 kg)  02/12/16  182 lb 12.8 oz (82.9 kg)  3. Immunizations/screenings/ancillary studies- offered and given TDAP. Offered HIV screen and will do Immunization History  Administered Date(s) Administered  . Influenza-Unspecified 03/15/2016  4. Prostate cancer screening- no family history, start at age 49   5. Colon cancer screening - no family history, start at age 44 6. Skin cancer prevention- advised regular sunscreen use  7. Testicular cancer screening- advised monthly self exams  8. Monogamous in married relationship- declines std testing other than HIV  Status of chronic or acute concerns   Hypothyroidism Now controlled on Levothyroxine  163mcg. Initial tsh 50  1 year for physical. We could also do a TSH check in 6 months if you would like (could be lab only visit)   Orders Placed This Encounter  Procedures  . CBC with Differential/Platelet  . Comprehensive metabolic panel    [place]    Order Specific Question:   Has the patient fasted?    Answer:   No  . Lipid panel    [place]    Order Specific Question:   Has the patient fasted?    Answer:   No  . TSH    [place]  . HIV antibody  . POCT Urinalysis Dipstick (Automated)   Return precautions advised.   Garret Reddish, MD

## 2016-05-08 NOTE — Patient Instructions (Addendum)
Tdap today  1 year for physical. We could also do a TSH check in 6 months if you would like (could be lab only visit)

## 2016-05-09 LAB — CBC WITH DIFFERENTIAL/PLATELET
BASOS ABS: 0 10*3/uL (ref 0.0–0.1)
Basophils Relative: 0.5 % (ref 0.0–3.0)
Eosinophils Absolute: 0.1 10*3/uL (ref 0.0–0.7)
Eosinophils Relative: 1.6 % (ref 0.0–5.0)
HCT: 44.5 % (ref 39.0–52.0)
HEMOGLOBIN: 15.3 g/dL (ref 13.0–17.0)
LYMPHS ABS: 1.5 10*3/uL (ref 0.7–4.0)
LYMPHS PCT: 28.5 % (ref 12.0–46.0)
MCHC: 34.4 g/dL (ref 30.0–36.0)
MCV: 85.8 fl (ref 78.0–100.0)
MONOS PCT: 9.7 % (ref 3.0–12.0)
Monocytes Absolute: 0.5 10*3/uL (ref 0.1–1.0)
NEUTROS PCT: 59.7 % (ref 43.0–77.0)
Neutro Abs: 3.1 10*3/uL (ref 1.4–7.7)
Platelets: 344 10*3/uL (ref 150.0–400.0)
RBC: 5.18 Mil/uL (ref 4.22–5.81)
RDW: 14 % (ref 11.5–15.5)
WBC: 5.2 10*3/uL (ref 4.0–10.5)

## 2016-05-09 LAB — HIV ANTIBODY (ROUTINE TESTING W REFLEX): HIV: NONREACTIVE

## 2016-08-01 ENCOUNTER — Other Ambulatory Visit: Payer: Self-pay | Admitting: Family Medicine

## 2016-08-01 DIAGNOSIS — R7989 Other specified abnormal findings of blood chemistry: Secondary | ICD-10-CM

## 2016-08-01 MED ORDER — LEVOTHYROXINE SODIUM 100 MCG PO TABS: 100.0000 ug | ORAL_TABLET | Freq: Every day | ORAL | 2 refills | Status: DC

## 2016-08-01 NOTE — Telephone Encounter (Signed)
Received a fax from the pharmacy requesting a 90 day supply.  Sent in on 03/28/16 for 6 months but 30 day supply.  Last TSH level on 05/08/16 and normal.  Sent to the pharmacy by e-scribe for 90 days and 2 additional refills (9 months).

## 2016-11-06 ENCOUNTER — Encounter: Payer: Self-pay | Admitting: Family Medicine

## 2016-11-06 ENCOUNTER — Ambulatory Visit (INDEPENDENT_AMBULATORY_CARE_PROVIDER_SITE_OTHER): Payer: 59 | Admitting: Family Medicine

## 2016-11-06 VITALS — BP 106/78 | HR 101 | Temp 98.3°F | Ht 68.5 in | Wt 182.2 lb

## 2016-11-06 DIAGNOSIS — J0100 Acute maxillary sinusitis, unspecified: Secondary | ICD-10-CM

## 2016-11-06 DIAGNOSIS — E039 Hypothyroidism, unspecified: Secondary | ICD-10-CM

## 2016-11-06 MED ORDER — PREDNISONE 20 MG PO TABS: ORAL_TABLET | ORAL | 0 refills | Status: DC

## 2016-11-06 NOTE — Patient Instructions (Addendum)
Please stop by lab before you go  I want you out of work through Monday to rest up as much as possible- make sure to stay well hydrated with water  Sinsusitis Viral based on <10 days, no double sickening, lack of severity of symptoms in first 3 days. Educated on signs that bacterial infection may have developed (symptoms over 10 days, double sickening).   Treatment: -considered steroid: we opted in- prednisone for 7 days -other symptomatic care with mucinex -Antibiotic indicated: not at present- would be if gets better then gets worse or if symptoms last over 10 days  Also will check thyroid as he felt poorly like this when first diagnosed with underactive thyroid  Finally, we reviewed reasons to return to care including if symptoms worsen or persist or new concerns arise (particularly fever or shortness of breath)  Meds ordered this encounter  Medications  . predniSONE (DELTASONE) 20 MG tablet    Sig: Take 2 pills for 3 days, 1 pill for 4 days    Dispense:  10 tablet    Refill:  0

## 2016-11-06 NOTE — Progress Notes (Signed)
PCP: Marin Olp, MD  Subjective:  Curtis Cox is a 41 y.o. year old very pleasant male patient who presents with sinusitis symptoms including nasal congestion, sinus tenderness mainly maxillary. Yellow/green nasal discharge.  -other symptoms include: feels run down, weak, very cold. IN fact spent an hour laying down in his shower in fetal position earlier today. States felt like this when diagnosed with hypothyroidism but has been compliant with meds 116mcg of levothyroxicine Lab Results  Component Value Date   TSH 1.47 05/08/2016  -day of illness:2 -Symptoms are improving very slightly -previous treatments: Nasal decongestant helped some -sick contacts/travel/risks: denies flu exposure.  -Hx of: allergies  ROS-denies fever, SOB, NVD, tooth pain  Pertinent Past Medical History-  Patient Active Problem List   Diagnosis Date Noted  . Hypothyroidism 05/08/2016    Priority: Medium  . Nephrolithiasis     Priority: Low  . Plantar fasciitis 05/07/2011    Priority: Low   Medications- reviewed  Current Outpatient Prescriptions  Medication Sig Dispense Refill  . levothyroxine (SYNTHROID, LEVOTHROID) 100 MCG tablet Take 1 tablet (100 mcg total) by mouth daily before breakfast. 90 tablet 2  . predniSONE (DELTASONE) 20 MG tablet Take 2 pills for 3 days, 1 pill for 4 days 10 tablet 0   No current facility-administered medications for this visit.     Objective: BP 106/78 (BP Location: Left Arm, Patient Position: Sitting, Cuff Size: Large)   Pulse (!) 101   Temp 98.3 F (36.8 C) (Oral)   Ht 5' 8.5" (1.74 m)   Wt 182 lb 3.2 oz (82.6 kg)   SpO2 97%   BMI 27.30 kg/m  Gen: NAD, resting comfortably HEENT: Turbinates erythematous with yellow drainage, TM normal, pharynx mildly erythematous with no tonsilar exudate or edema, left maxillary sinus tenderness CV: RRR no murmurs rubs or gallops Lungs: CTAB no crackles, wheeze, rhonchi Abdomen: soft/nontender/nondistended/normal  bowel sounds. No rebound or guarding.  Ext: no edema Skin: warm, dry, no rash Neuro: grossly normal, moves all extremities  Assessment/Plan:  Sinsusitis Viral based on <10 days, no double sickening, lack of severity of symptoms in first 3 days. Educated on signs that bacterial infection may have developed (symptoms over 10 days, double sickening).   Treatment: -considered steroid: we opted in- prednisone for 7 days -other symptomatic care with mucinex -Antibiotic indicated: not at present- would be if gets better then gets worse or if symptoms last over 10 days  Also will check thyroid as he felt poorly like this when first diagnosed with underactive thyroid. High HR noted- more strongly suspect infectious process than severe hypothyroidism causing his fatigue especially with more acute onset of symptoms.   Finally, we reviewed reasons to return to care including if symptoms worsen or persist or new concerns arise (particularly fever or shortness of breath)  Meds ordered this encounter  Medications  . predniSONE (DELTASONE) 20 MG tablet    Sig: Take 2 pills for 3 days, 1 pill for 4 days    Dispense:  10 tablet    Refill:  0    Garret Reddish, MD

## 2016-11-07 LAB — TSH: TSH: 3.58 u[IU]/mL (ref 0.35–4.50)

## 2016-11-27 ENCOUNTER — Ambulatory Visit (INDEPENDENT_AMBULATORY_CARE_PROVIDER_SITE_OTHER): Payer: 59 | Admitting: Family Medicine

## 2016-11-27 ENCOUNTER — Encounter: Payer: Self-pay | Admitting: Family Medicine

## 2016-11-27 VITALS — BP 98/72 | HR 91 | Temp 98.1°F | Ht 68.5 in | Wt 182.5 lb

## 2016-11-27 DIAGNOSIS — J029 Acute pharyngitis, unspecified: Secondary | ICD-10-CM

## 2016-11-27 DIAGNOSIS — R0981 Nasal congestion: Secondary | ICD-10-CM | POA: Diagnosis not present

## 2016-11-27 DIAGNOSIS — H6983 Other specified disorders of Eustachian tube, bilateral: Secondary | ICD-10-CM

## 2016-11-27 DIAGNOSIS — R590 Localized enlarged lymph nodes: Secondary | ICD-10-CM

## 2016-11-27 LAB — POCT RAPID STREP A (OFFICE): RAPID STREP A SCREEN: NEGATIVE

## 2016-11-27 NOTE — Progress Notes (Signed)
HPI:  Acute visit for throat pain: -reports intermittent hoarseness and drainage for months - has chronic sinus issues -for last few days has more pain in throat, nodes of throat and feels thyroid is swollen and hurts to swallow -denies:fever, SOB, NVD, tooth pain, dysphagia, wheezing -has tried: nothing -sick contacts/travel/risks: no reported flu, strep or tick exposure -Hx of: allergies -intermittent prior smoking hx -he is very worried that he has a mass in his neck  ROS: See pertinent positives and negatives per HPI.  Past Medical History:  Diagnosis Date  . Chicken pox   . Kidney stones   . Migraines   . Nephrolithiasis     Past Surgical History:  Procedure Laterality Date  . gamekeepers thumb surgery    . REPLACEMENT DISC ANTERIOR LUMBAR SPINE  2009   L5-S1    Family History  Problem Relation Age of Onset  . Healthy Mother   . Nephrolithiasis Father   . Healthy Sister   . Healthy Brother     Social History   Social History  . Marital status: Married    Spouse name: N/A  . Number of children: N/A  . Years of education: N/A   Social History Main Topics  . Smoking status: Former Smoker    Packs/day: 0.75    Years: 20.00    Types: Cigarettes    Quit date: 05/27/2015  . Smokeless tobacco: Never Used  . Alcohol use Yes     Comment: 2-3 per week  . Drug use: Unknown  . Sexual activity: Not Asked   Other Topics Concern  . None   Social History Narrative   Married. 1 son 10 in 2017.       Works at [employer]. HS education   Prior in [place].      Current Outpatient Prescriptions:  .  levothyroxine (SYNTHROID, LEVOTHROID) 100 MCG tablet, Take 1 tablet (100 mcg total) by mouth daily before breakfast., Disp: 90 tablet, Rfl: 2  EXAM:  Vitals:   11/27/16 1415  BP: 98/72  Pulse: 91  Temp: 98.1 F (36.7 C)    Body mass index is 27.35 kg/m.  GENERAL: vitals reviewed and listed above, alert, oriented, appears well hydrated and in no acute  distress  HEENT: atraumatic, conjunttiva clear, no obvious abnormalities on inspection of external nose and ears, normal appearance of ear canals and TMs, clear nasal congestion, mild post oropharyngeal erythema with PND, no tonsillar edema or exudate, no sinus TTP  NECK: no obvious masses on inspection except for mild bilat tender ant cervical lymphadenopathy  LUNGS: clear to auscultation bilaterally, no wheezes, rales or rhonchi, good air movement  CV: HRRR, no peripheral edema  MS: moves all extremities without noticeable abnormality  PSYCH: pleasant and cooperative, no obvious depression or anxiety  ASSESSMENT AND PLAN:  Discussed the following assessment and plan:  Sore throat - Plan: US Soft Tissue Head/Neck, POC Rapid Strep A  Sinus congestion  LAD (lymphadenopathy), anterior cervical - Plan: US Soft Tissue Head/Neck  Dysfunction of both eustachian tubes  -we discussed possible serious and likely etiologies, workup and treatment, treatment risks and return precautions - suspect VURI vs allergic rhinitis with mild reactive LAD vs other -after this discussion, Aman opted for strep testing,  US neck as he if very anxious about a mass, treatment for the allergies, close follow up with PCP -did advise will possibly need ENT eval if persistent issues  -follow up advised in 1 week -of course, we advised Leory Plowman  to return or notify a doctor immediately if symptoms worsen or persist or new concerns arise.   Patient Instructions  BEFORE YOU LEAVE: -strep testing -details of [place] -follow up: 1 week with PCP  Flonase 2 sprays each nostril daily  Zyrtec nightly  Get the ultrasound as planned  I hope you are feeling better soon! Seek care immediately if worsening, new concerns or you are not improving with treatment.     Colin Benton R., DO

## 2016-11-27 NOTE — Patient Instructions (Signed)
BEFORE YOU LEAVE: -strep testing -details of [place] -follow up: 1 week with PCP  Flonase 2 sprays each nostril daily  Zyrtec nightly  Get the ultrasound as planned  I hope you are feeling better soon! Seek care immediately if worsening, new concerns or you are not improving with treatment.

## 2016-11-28 ENCOUNTER — Ambulatory Visit
Admission: RE | Admit: 2016-11-28 | Discharge: 2016-11-28 | Disposition: A | Discharge status: Home / Self Care | Payer: 59 | Source: Ambulatory Visit | Attending: Family Medicine | Admitting: Family Medicine

## 2016-11-28 DIAGNOSIS — J029 Acute pharyngitis, unspecified: Secondary | ICD-10-CM

## 2016-11-28 DIAGNOSIS — R590 Localized enlarged lymph nodes: Secondary | ICD-10-CM

## 2016-12-04 ENCOUNTER — Ambulatory Visit (INDEPENDENT_AMBULATORY_CARE_PROVIDER_SITE_OTHER): Payer: 59 | Admitting: Family Medicine

## 2016-12-04 ENCOUNTER — Encounter: Payer: Self-pay | Admitting: Family Medicine

## 2016-12-04 VITALS — BP 90/70 | HR 90 | Temp 98.0°F | Ht 68.5 in | Wt 183.0 lb

## 2016-12-04 DIAGNOSIS — J029 Acute pharyngitis, unspecified: Secondary | ICD-10-CM | POA: Diagnosis not present

## 2016-12-04 DIAGNOSIS — R1310 Dysphagia, unspecified: Secondary | ICD-10-CM | POA: Diagnosis not present

## 2016-12-04 NOTE — Progress Notes (Signed)
Subjective:  Curtis Cox is a 41 y.o. year old very pleasant male patient who presents for/with See problem oriented charting ROS- no fever, chills, nausea, vomiting, unintentional weight loss   Past Medical History-  Patient Active Problem List   Diagnosis Date Noted  . Hypothyroidism 05/08/2016    Priority: Medium  . Nephrolithiasis     Priority: Low  . Plantar fasciitis 05/07/2011    Priority: Low    Medications- reviewed and updated Current Outpatient Prescriptions  Medication Sig Dispense Refill  . levothyroxine (SYNTHROID, LEVOTHROID) 100 MCG tablet Take 1 tablet (100 mcg total) by mouth daily before breakfast. 90 tablet 2   No current facility-administered medications for this visit.     Objective: BP 90/70 (BP Location: Left Arm, Patient Position: Sitting, Cuff Size: Large)   Pulse 90   Temp 98 F (36.7 C) (Oral)   Ht 5' 8.5" (1.74 m)   Wt 183 lb (83 kg)   SpO2 96%   BMI 27.42 kg/m  Gen: NAD, resting comfortably Oropharynx largely normal. Thyroid normal, neck without lymphadenopathy. Some pain with palpation to left and below cricoid cartilage CV: RRR no murmurs rubs or gallops Lungs: CTAB no crackles, wheeze, rhonchi Abdomen: soft/nontender/nondistended/normal bowel sounds. No rebound or guarding.  Ext: no edema Skin: warm, dry, no rash over throat  Assessment/Plan:  Sore throat - Plan: Ambulatory referral to ENT Odynophagia - Plan: Ambulatory referral to ENT S: thought back and throatactually had been hurting for 5-6 months and thought sinus/allergy drainage. Over last few weeks, Got more severe with swallowing- was hurting constantly but worse with swallowing. Presented to Dr. Maudie Mercury last week and thought potentially allergic. Taking zyrtec nightly. Did not start flonase daily but did start nasocort a few times. No fever or chills. A month ago treated with prednisone for maxillary sinusitis and at least had improvement in fatigue- pain resolved in his  sinuses as well with no significant drainage recently.   After last visit had neck/thyroid ultrasound which was reassuring/normal  Intermittent hoarseness and a few times completely lost his voice. Still having trouble with swallowing- has pain up to 3-4/10. Constant 1-2/10. Does not feel like food or liquid getting stuck but did feel that slightly before last visit.  A/P:  41 year old male with at least 2 weeks of more significant odynophagia without any concrete cause on exam. Slightly better with allergy treatment but not substantial improvement. We agreed to ENT referral to further evaluate. Has seen Dr. Thornell Mule in past so will try to get him plugged back in.  Patient Instructions  We will call you within a week or two about your referral to Dr. Thornell Mule. If you do not hear by next week, give [place] a call.   Trial aleve one pill twice a day for the next 5 days. Try to eat something at least small with this.    Orders Placed This Encounter  Procedures  . Ambulatory referral to ENT    Referral Priority:   Routine    Referral Type:   Consultation    Referral Reason:   Specialty Services Required    Requested Specialty:   Otolaryngology    Number of Visits Requested:   1   The duration of face-to-face time during this visit was greater than 15 minutes. Greater than 50% of this time was spent in counseling, explanation of diagnosis, planning of further management, and/or coordination of care including comforting over patient concerns of sore throat and developing follow up  plan. .     Return precautions advised.  Garret Reddish, MD

## 2016-12-04 NOTE — Patient Instructions (Signed)
We will call you within a week or two about your referral to Dr. Thornell Mule. If you do not hear by next week, give [place] a call.   Trial aleve one pill twice a day for the next 5 days. Try to eat something at least small with this.

## 2016-12-29 ENCOUNTER — Ambulatory Visit (HOSPITAL_COMMUNITY)
Admission: EM | Admit: 2016-12-29 | Discharge: 2016-12-29 | Disposition: A | Discharge status: Home / Self Care | Payer: 59 | Attending: Internal Medicine | Admitting: Internal Medicine

## 2016-12-29 ENCOUNTER — Encounter (HOSPITAL_COMMUNITY): Payer: Self-pay | Admitting: Emergency Medicine

## 2016-12-29 DIAGNOSIS — S0181XA Laceration without foreign body of other part of head, initial encounter: Secondary | ICD-10-CM

## 2016-12-29 HISTORY — DX: Disorder of thyroid, unspecified: E07.9

## 2016-12-29 MED ORDER — LIDOCAINE-EPINEPHRINE-TETRACAINE (LET) SOLUTION
NASAL | Status: AC
  Filled 2016-12-29: qty 3

## 2016-12-29 MED ORDER — LIDOCAINE-EPINEPHRINE-TETRACAINE (LET) SOLUTION
3.0000 mL | Freq: Once | NASAL | Status: AC
  Administered 2016-12-29: 17:00:00 3 mL via TOPICAL

## 2016-12-29 NOTE — ED Provider Notes (Signed)
[facility]    CSN: [id number] Arrival date & time: 12/29/16  1449     History   Chief Complaint Chief Complaint  Patient presents with  . Laceration    HPI Curtis Cox is a 41 y.o. male. He presents today with a laceration below the lower lip, he was using a socket wrench and the socket broke, and the handle he was holding under force came up and struck him in the chin. He has a 1 inch laceration in the skin fold just beneath the lower lip. It is not through and through. His teeth feel like they are in the right place to him, and he was not knocked down. No headache. Was able to walk into the urgent care independently. Denies other injury. Had a tetanus shot in December 2017.    HPI  Past Medical History:  Diagnosis Date  . Chicken pox   . Kidney stones   . Migraines   . Nephrolithiasis   . Thyroid disease     Patient Active Problem List   Diagnosis Date Noted  . Hypothyroidism 05/08/2016  . Nephrolithiasis   . Plantar fasciitis 05/07/2011    Past Surgical History:  Procedure Laterality Date  . gamekeepers thumb surgery    . REPLACEMENT DISC ANTERIOR LUMBAR SPINE  2009   L5-S1       Home Medications    Prior to Admission medications   Medication Sig Start Date End Date Taking? Authorizing Provider  levothyroxine (SYNTHROID, LEVOTHROID) 100 MCG tablet Take 1 tablet (100 mcg total) by mouth daily before breakfast. 08/01/16   Marin Olp, MD    Family History Family History  Problem Relation Age of Onset  . Healthy Mother   . Nephrolithiasis Father   . Healthy Sister   . Healthy Brother     Social History Social History  Substance Use Topics  . Smoking status: Former Smoker    Packs/day: 0.75    Years: 20.00    Types: Cigarettes    Quit date: 05/27/2015  . Smokeless tobacco: Never Used  . Alcohol use Yes     Comment: 2-3 per week     Allergies   Penicillins   Review of Systems Review of Systems  All other systems  reviewed and are negative.    Physical Exam Triage Vital Signs ED Triage Vitals  Enc Vitals Group     BP 12/29/16 1602 117/84     Pulse Rate 12/29/16 1602 93     Resp 12/29/16 1602 16     Temp 12/29/16 1602 98.2 F (36.8 C)     Temp src --      SpO2 12/29/16 1602 99 %     Weight --      Height --      Pain Score 12/29/16 1600 1     Pain Loc --    Updated Vital Signs BP 117/84 (BP Location: Right Arm)   Pulse 93   Temp 98.2 F (36.8 C)   Resp 16   SpO2 99%   Physical Exam  Constitutional: He is oriented to person, place, and time. No distress.  Alert, nicely groomed  HENT:  Head: Atraumatic.  1 inch laceration below the lower lip that is deep, but does not extend through to the mucosal surface. Located in the skin fold of the chin, and is not gaping. No hemotympanum, no nasal septal hematoma, no blood in the nose. No apparent injury to lips or tongue. No  focal swelling or bruising of the rest of the face or head.  Eyes: Pupils are equal, round, and reactive to light. EOM are normal.  Conjugate gaze, no eye redness/drainage  Neck: Neck supple.  Cardiovascular: Normal rate.   Pulmonary/Chest: No respiratory distress.  Abdominal: He exhibits no distension.  Musculoskeletal: Normal range of motion.  Neurological: He is alert and oriented to person, place, and time.  Face is symmetric, speech is clear/coherent Patient was able to walk into the urgent care independently, and climb on/off the exam table  Skin: Skin is warm and dry.  No cyanosis  Nursing note and vitals reviewed.    UC Treatments / Results   Procedures Procedures (including critical care time)  Medications Ordered in UC Medications  lidocaine-EPINEPHrine-tetracaine (LET) solution (3 mLs Topical Given 12/29/16 1647)   Wound washed carefully with wound cleaner and saline and patted dry.  Closed wound with tissue adhesive two applications, with good result.  Final Clinical Impressions(s) / UC Diagnoses    Final diagnoses:  Facial laceration, initial encounter   Keep wound dry for the next 3-5 days.  Do not use antibiotic ointment or any greasy preparation on the wound.  Recheck for any increasing redness/swelling/drainage/pain at site or new fever >100.5.  Anticipate glue will begin flaking off gradually over the next several days.  Controlled Substance Prescriptions [place] Controlled Substance Registry consulted? Not Applicable   Sherlene Shams, MD 12/31/16 2138

## 2016-12-29 NOTE — Discharge Instructions (Addendum)
Keep wound dry for the next 3-5 days.  Do not use antibiotic ointment or any greasy preparation on the wound.  Recheck for any increasing redness/swelling/drainage/pain at site or new fever >100.5.  Anticipate glue will begin flaking off gradually over the next several days.

## 2016-12-29 NOTE — ED Triage Notes (Signed)
Laceration to chin, cut does not go all the way through.  Patient was working on a car, a tool broke and popped self in the face.

## 2017-05-20 ENCOUNTER — Other Ambulatory Visit: Payer: Self-pay

## 2017-05-20 DIAGNOSIS — R7989 Other specified abnormal findings of blood chemistry: Secondary | ICD-10-CM

## 2017-05-20 MED ORDER — LEVOTHYROXINE SODIUM 100 MCG PO TABS: 100.0000 ug | ORAL_TABLET | Freq: Every day | ORAL | 2 refills | Status: DC

## 2017-11-23 IMAGING — US US SOFT TISSUE HEAD/NECK
1 series · 14 of 25 positions shown · non-contrast
Comparison: None.

CLINICAL DATA: Sore throat, sinus drainage, swollen thyroid,
anterior cervical lymphadenopathy

EXAM:
THYROID ULTRASOUND
TECHNIQUE: Ultrasound examination of the thyroid gland and adjacent soft
tissues was performed.

[Series 1: us soft tissue head/neck · 0.06mm/px · 14 of 41 slices shown]
[im 1/41]
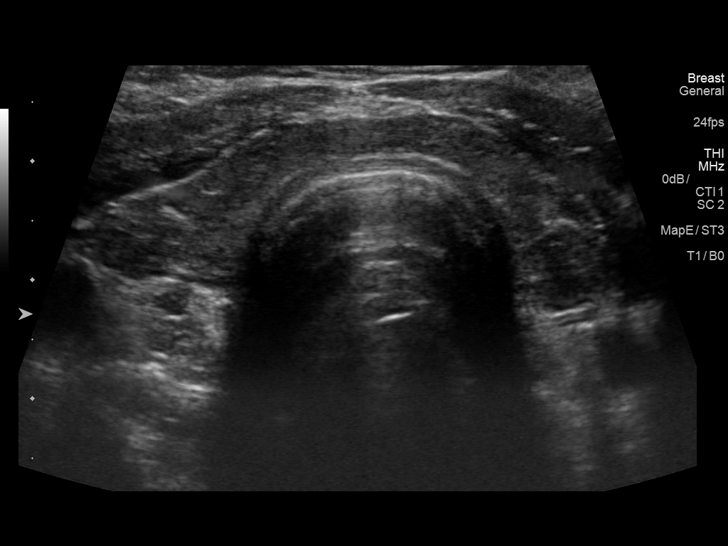
[im 4/41]
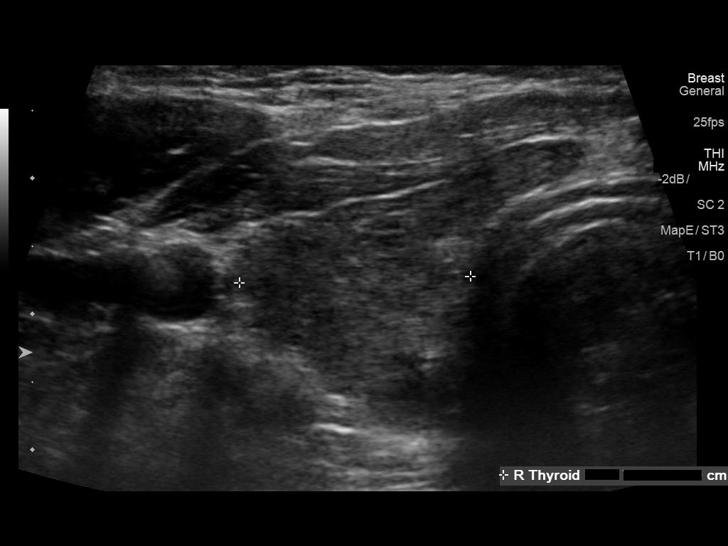
[im 7/41]
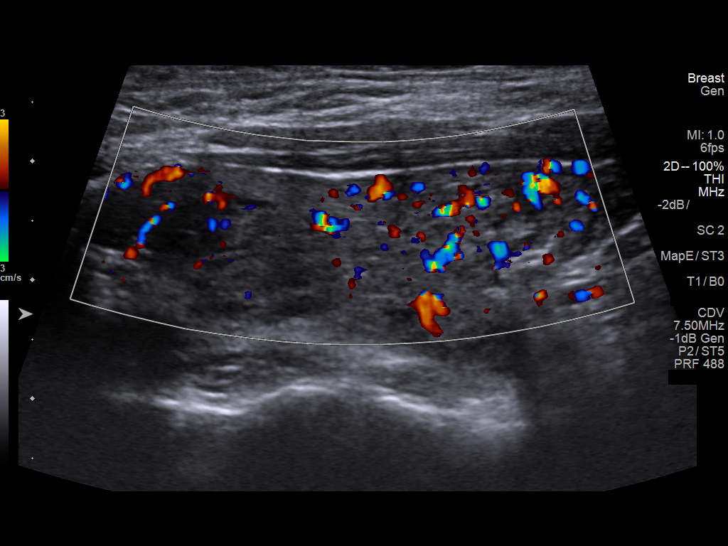
[im 11/41]
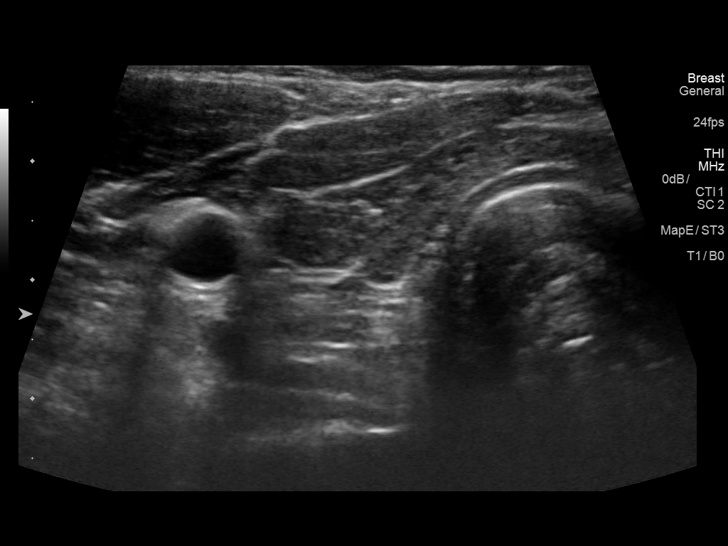
[im 14/41]
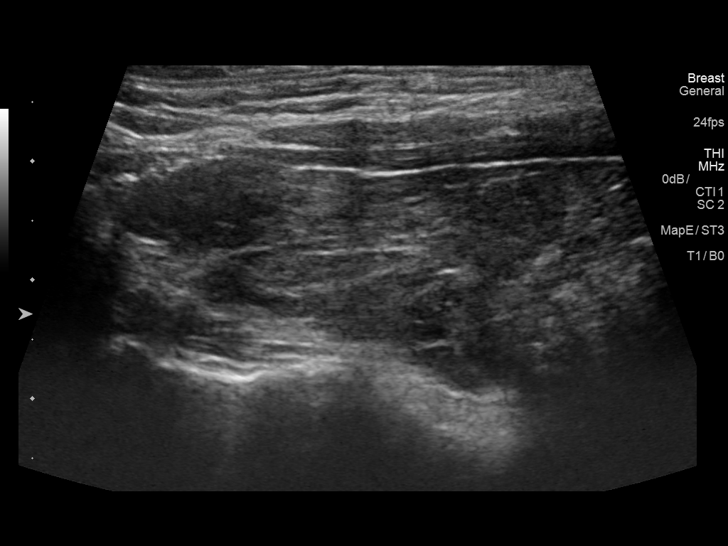
[im 16/41]
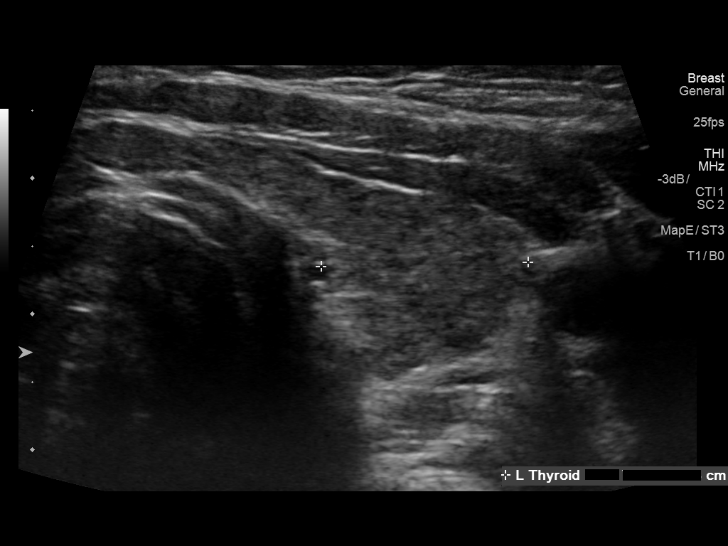
[im 19/41]
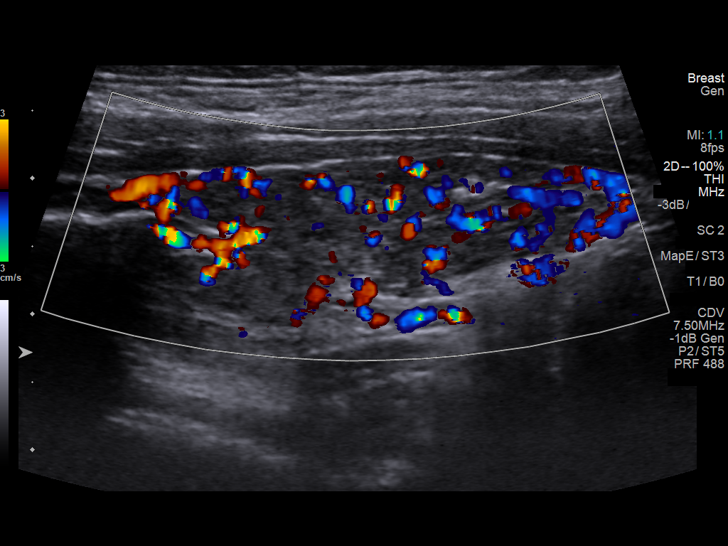
[im 22/41]
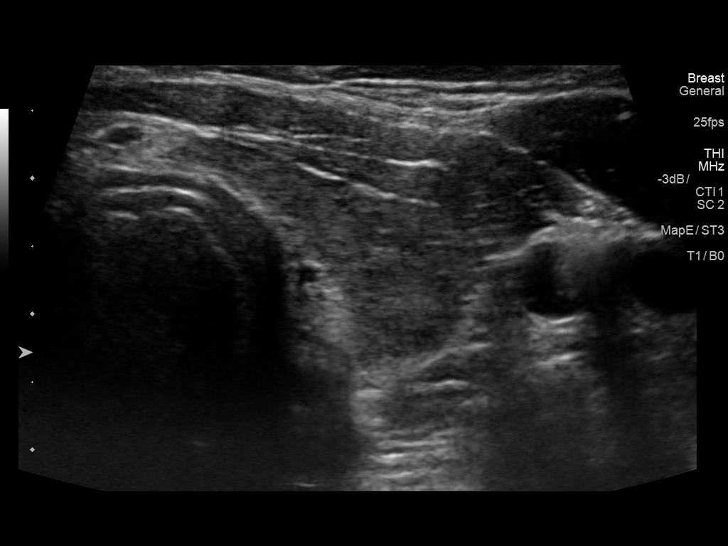
[im 26/41]
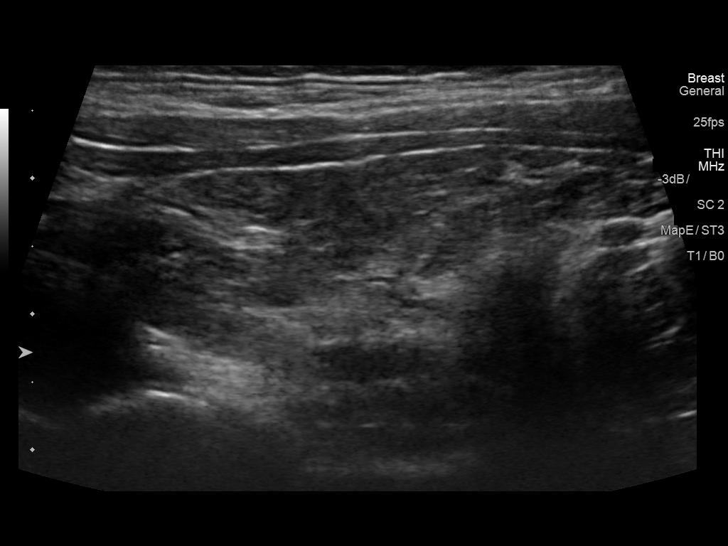
[im 27/41]
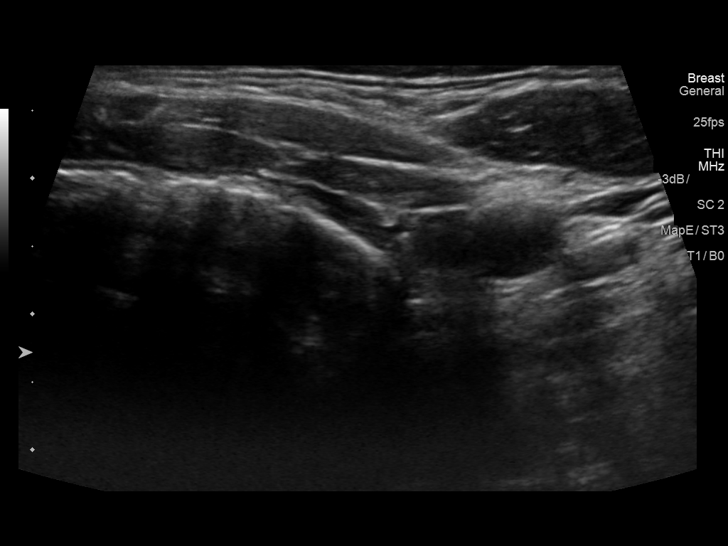
[im 31/41]
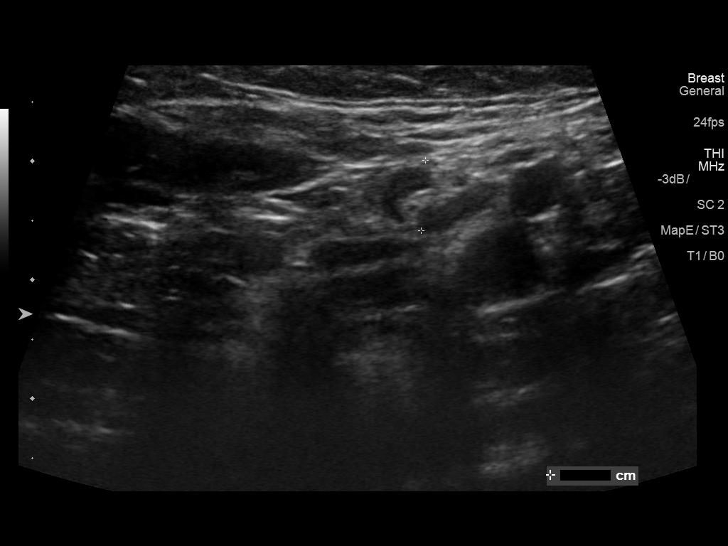
[im 34/41]
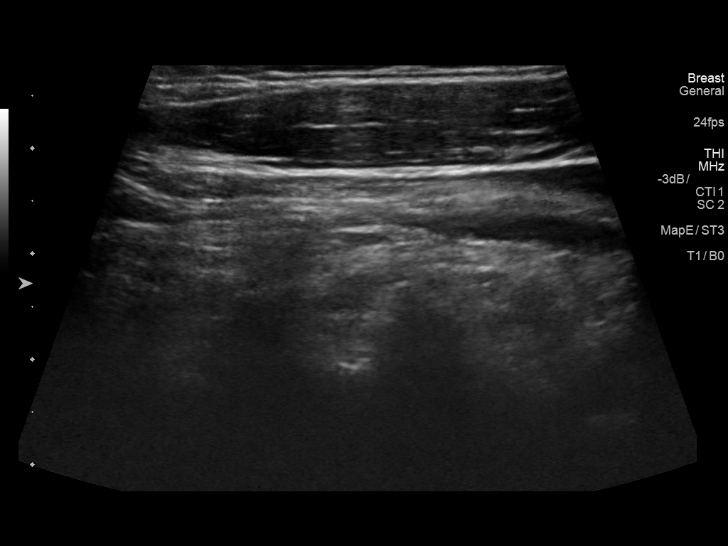
[im 37/41]
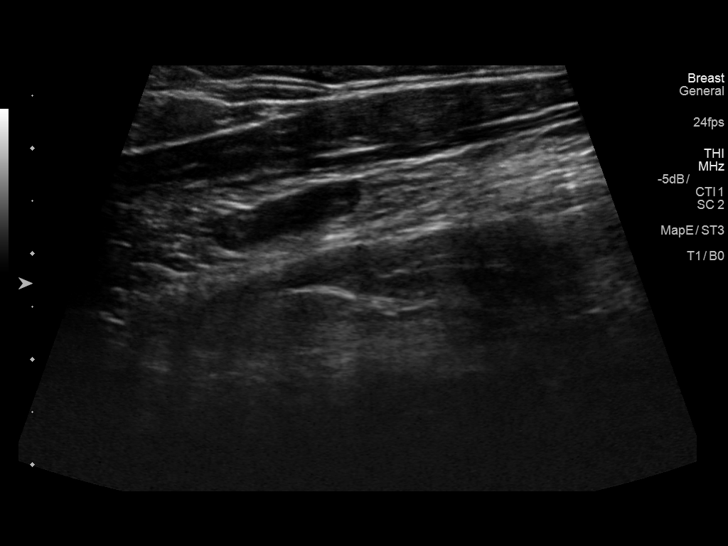
[im 41/41]
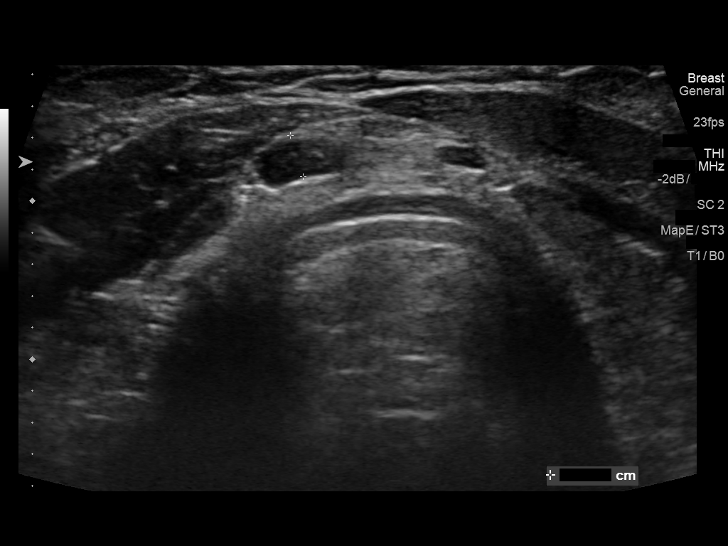

[14 of 25 positions shown; findings below may reference images not displayed]

FINDINGS: Parenchymal Echotexture: Mildly heterogenous, hyperemic

Isthmus: 0.3 cm thickness

Right lobe: 4.8 x 1.7 x 1.7 cm

Left lobe: 4.4 x 1.4 x 1.5 cm

_________________________________________________________

Estimated total number of nodules >/= 1 cm: 0

Number of spongiform nodules >/=  2 cm not described below (TR1): 0

Number of mixed cystic and solid nodules >/= 1.5 cm not described
below (TR2): 0

_________________________________________________________

No discrete nodules are seen within the thyroid gland. No
pathologically enlarged cervical lymph nodes identified.
IMPRESSION: 1. Normal-sized heterogeneous thyroid without nodule.

The above is in keeping with the ACR TI-RADS recommendations - [HOSPITAL] 8588;[DATE].

## 2018-02-23 ENCOUNTER — Other Ambulatory Visit: Payer: Self-pay | Admitting: Family Medicine

## 2018-02-23 DIAGNOSIS — E039 Hypothyroidism, unspecified: Secondary | ICD-10-CM

## 2018-02-23 DIAGNOSIS — R7989 Other specified abnormal findings of blood chemistry: Secondary | ICD-10-CM

## 2018-02-25 ENCOUNTER — Other Ambulatory Visit (INDEPENDENT_AMBULATORY_CARE_PROVIDER_SITE_OTHER): Payer: 59

## 2018-02-25 DIAGNOSIS — E039 Hypothyroidism, unspecified: Secondary | ICD-10-CM

## 2018-02-25 LAB — TSH: TSH: 7.58 u[IU]/mL — AB (ref 0.35–4.50)

## 2018-03-01 ENCOUNTER — Other Ambulatory Visit: Payer: Self-pay

## 2018-03-01 DIAGNOSIS — E039 Hypothyroidism, unspecified: Secondary | ICD-10-CM

## 2018-03-01 MED ORDER — LEVOTHYROXINE SODIUM 112 MCG PO TABS: 112.0000 ug | ORAL_TABLET | Freq: Every day | ORAL | 3 refills | Status: DC

## 2018-03-19 ENCOUNTER — Ambulatory Visit (INDEPENDENT_AMBULATORY_CARE_PROVIDER_SITE_OTHER): Payer: 59

## 2018-03-19 DIAGNOSIS — Z23 Encounter for immunization: Secondary | ICD-10-CM | POA: Diagnosis not present

## 2018-03-19 NOTE — Progress Notes (Signed)
Patient here today for Flu vaccine. VIS given. Administered in left arm. Tolerated well

## 2018-03-19 NOTE — Patient Instructions (Signed)
There are no preventive care reminders to display for this patient.  No flowsheet data found.  

## 2018-04-12 ENCOUNTER — Other Ambulatory Visit: Payer: 59

## 2018-04-19 ENCOUNTER — Other Ambulatory Visit (INDEPENDENT_AMBULATORY_CARE_PROVIDER_SITE_OTHER): Payer: 59

## 2018-04-19 DIAGNOSIS — E039 Hypothyroidism, unspecified: Secondary | ICD-10-CM | POA: Diagnosis not present

## 2018-04-19 LAB — TSH: TSH: 3.3 u[IU]/mL (ref 0.35–4.50)

## 2018-05-29 ENCOUNTER — Encounter: Payer: Self-pay | Admitting: Family Medicine

## 2019-02-25 ENCOUNTER — Other Ambulatory Visit: Payer: Self-pay

## 2019-02-25 ENCOUNTER — Telehealth: Payer: Self-pay | Admitting: Family Medicine

## 2019-02-25 MED ORDER — LEVOTHYROXINE SODIUM 112 MCG PO TABS: 112.0000 ug | ORAL_TABLET | Freq: Every day | ORAL | 0 refills | Status: DC

## 2019-02-25 NOTE — Telephone Encounter (Signed)
Refill sent.

## 2019-02-25 NOTE — Telephone Encounter (Signed)
Medication Refill - Medication: levothyroxine (SYNTHROID, LEVOTHROID) 112 MCG tablet/ Pt stated he is flying out today and his rx does not have any refills.   Has the patient contacted their pharmacy? Yes.   (Agent: If no, request that the patient contact the pharmacy for the refill.) (Agent: If yes, when and what did the pharmacy advise?)  Preferred Pharmacy (with phone number or street name):  [id number] [place], [place] [phone] (Phone) [phone] (Fax)    Agent: Please be advised that RX refills may take up to 3 business days. We ask that you follow-up with your pharmacy.

## 2019-03-19 ENCOUNTER — Other Ambulatory Visit: Payer: Self-pay | Admitting: Family Medicine

## 2019-04-21 ENCOUNTER — Other Ambulatory Visit: Payer: Self-pay | Admitting: Family Medicine

## 2019-05-06 ENCOUNTER — Other Ambulatory Visit: Payer: Self-pay | Admitting: Family Medicine

## 2019-05-11 ENCOUNTER — Telehealth: Payer: Self-pay

## 2019-05-11 ENCOUNTER — Other Ambulatory Visit: Payer: Self-pay

## 2019-05-11 NOTE — Telephone Encounter (Signed)
Copied from Secretary [id number]. Topic: Appointment Scheduling - Scheduling Inquiry for Clinic >> May 11, 2019 10:43 AM Rayann Heman wrote: Reason for CRM: pt would like an order for labs regarding tyroid so that he can get medication refilled. Please advise

## 2019-05-11 NOTE — Telephone Encounter (Signed)
Call patient let him know I have called in 1 90 day supply. He is due for CPE and will need to be seen and get labs before he can get any more refills.

## 2019-05-11 NOTE — Telephone Encounter (Signed)
Patient has been schedule for 07/11/19 for his CPE

## 2019-05-30 ENCOUNTER — Other Ambulatory Visit: Payer: Self-pay | Admitting: Family Medicine

## 2019-06-14 ENCOUNTER — Ambulatory Visit: Payer: 59 | Attending: Internal Medicine

## 2019-06-14 DIAGNOSIS — Z20822 Contact with and (suspected) exposure to covid-19: Secondary | ICD-10-CM

## 2019-06-15 LAB — NOVEL CORONAVIRUS, NAA: SARS-CoV-2, NAA: NOT DETECTED

## 2019-06-21 ENCOUNTER — Telehealth: Payer: Self-pay | Admitting: Family Medicine

## 2019-06-21 NOTE — Telephone Encounter (Signed)
MEDICATION: levothyroxine 112 MCG tablet  PHARMACY: [facility] [phone]. [place], [place]   Comments:  Please advise.   **Let patient know to contact pharmacy at the end of the day to make sure medication is ready. **  ** Please notify patient to allow 48-72 hours to process**  **Encourage patient to contact the pharmacy for refills or they can request refills through [facility]**

## 2019-06-21 NOTE — Telephone Encounter (Signed)
This was refilled in 04/2019 #90 to get pt to appointment in Feb.

## 2019-07-11 ENCOUNTER — Encounter: Payer: 59 | Admitting: Family Medicine

## 2019-08-10 ENCOUNTER — Other Ambulatory Visit: Payer: Self-pay

## 2019-08-10 ENCOUNTER — Encounter: Payer: Self-pay | Admitting: Family Medicine

## 2019-08-10 ENCOUNTER — Ambulatory Visit (INDEPENDENT_AMBULATORY_CARE_PROVIDER_SITE_OTHER): Payer: 59 | Admitting: Family Medicine

## 2019-08-10 VITALS — BP 100/68 | HR 88 | Temp 98.0°F | Ht 68.5 in | Wt 190.2 lb

## 2019-08-10 DIAGNOSIS — E039 Hypothyroidism, unspecified: Secondary | ICD-10-CM

## 2019-08-10 DIAGNOSIS — Z87891 Personal history of nicotine dependence: Secondary | ICD-10-CM

## 2019-08-10 DIAGNOSIS — Z0000 Encounter for general adult medical examination without abnormal findings: Secondary | ICD-10-CM | POA: Diagnosis not present

## 2019-08-10 DIAGNOSIS — E785 Hyperlipidemia, unspecified: Secondary | ICD-10-CM | POA: Diagnosis not present

## 2019-08-10 LAB — CBC WITH DIFFERENTIAL/PLATELET
Basophils Absolute: 0.1 10*3/uL (ref 0.0–0.1)
Basophils Relative: 2.1 % (ref 0.0–3.0)
Eosinophils Absolute: 0.1 10*3/uL (ref 0.0–0.7)
Eosinophils Relative: 1.8 % (ref 0.0–5.0)
HCT: 45.5 % (ref 39.0–52.0)
Hemoglobin: 15.4 g/dL (ref 13.0–17.0)
Lymphocytes Relative: 27.3 % (ref 12.0–46.0)
Lymphs Abs: 1.7 10*3/uL (ref 0.7–4.0)
MCHC: 33.8 g/dL (ref 30.0–36.0)
MCV: 88.5 fl (ref 78.0–100.0)
Monocytes Absolute: 0.4 10*3/uL (ref 0.1–1.0)
Monocytes Relative: 6 % (ref 3.0–12.0)
Neutro Abs: 3.9 10*3/uL (ref 1.4–7.7)
Neutrophils Relative %: 62.8 % (ref 43.0–77.0)
Platelets: 353 10*3/uL (ref 150.0–400.0)
RBC: 5.14 Mil/uL (ref 4.22–5.81)
RDW: 13.8 % (ref 11.5–15.5)
WBC: 6.2 10*3/uL (ref 4.0–10.5)

## 2019-08-10 LAB — COMPREHENSIVE METABOLIC PANEL
ALT: 19 U/L (ref 0–53)
AST: 22 U/L (ref 0–37)
Albumin: 4.8 g/dL (ref 3.5–5.2)
Alkaline Phosphatase: 71 U/L (ref 39–117)
BUN: 19 mg/dL (ref 6–23)
CO2: 25 mEq/L (ref 19–32)
Calcium: 10.1 mg/dL (ref 8.4–10.5)
Chloride: 103 mEq/L (ref 96–112)
Creatinine, Ser: 1.15 mg/dL (ref 0.40–1.50)
GFR: 68.92 mL/min (ref >60.00)
Glucose, Bld: 79 mg/dL (ref 70–99)
Potassium: 4 mEq/L (ref 3.5–5.1)
Sodium: 139 mEq/L (ref 135–145)
Total Bilirubin: 0.7 mg/dL (ref 0.2–1.2)
Total Protein: 8 g/dL (ref 6.0–8.3)

## 2019-08-10 LAB — POC URINALSYSI DIPSTICK (AUTOMATED)
Bilirubin, UA: NEGATIVE
Blood, UA: NEGATIVE
Glucose, UA: NEGATIVE
Ketones, UA: NEGATIVE
Leukocytes, UA: NEGATIVE
Nitrite, UA: NEGATIVE
Protein, UA: NEGATIVE
Spec Grav, UA: >=1.03 — AB (ref 1.010–1.025)
Urobilinogen, UA: 0.2 E.U./dL
pH, UA: 5 (ref 5.0–8.0)

## 2019-08-10 LAB — LIPID PANEL
Cholesterol: 243 mg/dL — ABNORMAL HIGH (ref 0–200)
HDL: 40 mg/dL (ref >39.00)
NonHDL: 202.59
Total CHOL/HDL Ratio: 6
Triglycerides: 364 mg/dL — ABNORMAL HIGH (ref 0.0–149.0)
VLDL: 72.8 mg/dL — ABNORMAL HIGH (ref 0.0–40.0)

## 2019-08-10 LAB — LDL CHOLESTEROL, DIRECT: Direct LDL: 147 mg/dL

## 2019-08-10 LAB — TSH: TSH: 13.46 u[IU]/mL — ABNORMAL HIGH (ref 0.35–4.50)

## 2019-08-10 NOTE — Patient Instructions (Addendum)
Health Maintenance Due  Topic Date Due  . INFLUENZA VACCINE  Did not want to get will be getting covid soon  12/25/2018   Please stop by lab before you go If you do not have mychart- we will call you about results within 5 business days of [place] receiving them.  If you have mychart- we will send your results within 3 business days of [place] receiving them.  If abnormal or we want to clarify a result, we will call or mychart you to make sure you receive the message.  If you have questions or concerns or don't hear within 5 business days, please send [place] a message or call us.    We held off on levothyroxine refill to make sure you are on the right dose with weight gain- let us know when you get  Closer to needing a fill

## 2019-08-10 NOTE — Addendum Note (Signed)
Addended by: Francis Dowse T on: 08/10/2019 09:12 AM   Modules accepted: Orders

## 2019-08-10 NOTE — Progress Notes (Signed)
Phone: [phone]    Subjective:  Patient presents today for their annual physical. Chief complaint-noted.   See problem oriented charting- ROS- full  review of systems was completed and negative  except for: rash on left forearm  The following were reviewed and entered/updated in epic: Past Medical History:  Diagnosis Date  . Chicken pox   . Kidney stones   . Migraines   . Nephrolithiasis   . Thyroid disease    Patient Active Problem List   Diagnosis Date Noted  . Hyperlipidemia, unspecified 08/10/2019    Priority: Medium  . Hypothyroidism 05/08/2016    Priority: Medium  . Nephrolithiasis     Priority: Low  . Plantar fasciitis 05/07/2011    Priority: Low   Past Surgical History:  Procedure Laterality Date  . gamekeepers thumb surgery    . REPLACEMENT DISC ANTERIOR LUMBAR SPINE  2009   L5-S1    Family History  Problem Relation Age of Onset  . Healthy Mother   . Nephrolithiasis Father   . Healthy Sister   . Healthy Brother     Medications- reviewed and updated Current Outpatient Medications  Medication Sig Dispense Refill  . levothyroxine (SYNTHROID) 112 MCG tablet TAKE 1 TABLET BY MOUTH EVERY DAY 90 tablet 0   No current facility-administered medications for this visit.    Allergies-reviewed and updated Allergies  Allergen Reactions  . Penicillins     Social History   Social History Narrative   Married. 1 son 10 in 2017.       Works at [employer]. HS education   Prior in [place].       Objective:  BP 100/68   Pulse 88   Temp 98 F (36.7 C) (Temporal)   Ht 5' 8.5" (1.74 m)   Wt 190 lb 3.2 oz (86.3 kg)   SpO2 98%   BMI 28.50 kg/m  Gen: NAD, resting comfortably HEENT: Mask not removed due to covid 19. TM normal. Bridge of nose normal. Eyelids normal.  Neck: no thyromegaly or cervical lymphadenopathy  CV: RRR no murmurs rubs or gallops Lungs: CTAB no crackles, wheeze, rhonchi Abdomen: soft/nontender/nondistended/normal bowel sounds.  No rebound or guarding.  Ext: no edema Skin: warm, dry Neuro: grossly normal, moves all extremities, PERRLA    Assessment and Plan:  44 y.o. male presenting for annual physical.  Health Maintenance counseling: 1. Anticipatory guidance: Patient counseled regarding regular dental exams -q6 months other than with covid, eye exams - advised to get updated eye eam,  avoiding smoking and second hand smoke , limiting alcohol to 2 beverages per day-  Case of beer lasts a month- less than 1 a day.   2. Risk factor reduction:  Advised patient of need for regular exercise and diet rich and fruits and vegetables to reduce risk of heart attack and stroke. Exercise- not exercising much- but trying to get more actve with his son- more walks and bike rides. Diet-has cut out red as does not feel well after eating that. Feels like portion size is ok. Doing mainly grilled chicken and fish. Does some snacks like nutty bars or pop tarts but overall calorie #s are not high- may be thyroid issues- Wt Readings from Last 3 Encounters:  08/10/19 190 lb 3.2 oz (86.3 kg)  12/04/16 183 lb (83 kg)  11/27/16 182 lb 8 oz (82.8 kg)  3. Immunizations/screenings/ancillary studies- he is planning to get covid 19 vaccination Immunization History  Administered Date(s) Administered  . Influenza,inj,Quad PF,6+ Mos 03/19/2018  .  Influenza-Unspecified 03/15/2016  . Tdap 05/08/2016  4. Prostate cancer screening- no family history, start at age 74   5. Colon cancer screening - no family history, start at age 51 6. Skin cancer screening/prevention- no dermatologist. advised regular sunscreen use. Denies worrisome, changing, or new skin lesions.  7. Testicular cancer screening- advised monthly self exams  8. STD screening- patient opts out as monogomous 9. former smoker- quit 2017 but had 15 pack years  Status of chronic or acute concerns   Rash on left forearm - started on terbinafine a few days ago and already seeing improvement-  slight circular lesion- appears to be around cm or less  Hypothyroidism-compliant with levothyroxine 112 mcg-update TSH today. Has had some weight gain so we want to make sure not undertreated.  - we were going to refill but with it possibly being off we opted to hold off  Hyperlipidemia-update lipid panel today.  Suspect 10-year ASCVD risk would be under 7.5%-could continue to work on healthy eating/regular exercise  Recommended follow up: 1 year physical  Lab/Order associations: fasting   ICD-10-CM   1. Preventative health care  Z00.00   2. Hypothyroidism, unspecified type  E03.9   3. Hyperlipidemia, unspecified hyperlipidemia type  E78.5     No orders of the defined types were placed in this encounter.  Return precautions advised.   Curtis Reddish, MD

## 2019-08-11 ENCOUNTER — Encounter: Payer: Self-pay | Admitting: Family Medicine

## 2019-08-12 ENCOUNTER — Other Ambulatory Visit: Payer: Self-pay

## 2019-08-12 DIAGNOSIS — E039 Hypothyroidism, unspecified: Secondary | ICD-10-CM

## 2019-08-12 MED ORDER — LEVOTHYROXINE SODIUM 125 MCG PO TABS: 125.0000 ug | ORAL_TABLET | Freq: Every day | ORAL | 3 refills | Status: DC

## 2019-08-17 ENCOUNTER — Encounter: Payer: Self-pay | Admitting: Family Medicine

## 2019-08-18 ENCOUNTER — Ambulatory Visit: Payer: 59 | Attending: Internal Medicine

## 2019-08-18 DIAGNOSIS — Z23 Encounter for immunization: Secondary | ICD-10-CM

## 2019-08-18 NOTE — Progress Notes (Signed)
   Covid-19 Vaccination Clinic  Name:  Curtis Cox    MRN: [id number] DOB: Jul 29, 1975  08/18/2019  [name] was observed post Covid-19 immunization for 15 minutes without incident. He was provided with Vaccine Information Sheet and instruction to access the V-Safe system.   [name] was instructed to call 911 with any severe reactions post vaccine: Marland Kitchen Difficulty breathing  . Swelling of face and throat  . A fast heartbeat  . A bad rash all over body  . Dizziness and weakness   Immunizations Administered    Name Date Dose VIS Date Route   Pfizer COVID-19 Vaccine 08/18/2019 10:50 AM 0.3 mL 05/06/2019 Intramuscular   Manufacturer: [place]   Lot: [id number]   [place]: [id number]

## 2019-09-12 ENCOUNTER — Ambulatory Visit: Payer: 59 | Attending: Internal Medicine

## 2019-09-12 DIAGNOSIS — Z23 Encounter for immunization: Secondary | ICD-10-CM

## 2019-09-12 NOTE — Progress Notes (Signed)
   Covid-19 Vaccination Clinic  Name:  Curtis Cox    MRN: [id number] DOB: 06/12/1975  09/12/2019  Curtis Cox was observed post Covid-19 immunization for 15 minutes without incident. He was provided with Vaccine Information Sheet and instruction to access the V-Safe system.   Curtis Cox was instructed to call 911 with any severe reactions post vaccine: Marland Kitchen Difficulty breathing  . Swelling of face and throat  . A fast heartbeat  . A bad rash all over body  . Dizziness and weakness   Immunizations Administered    Name Date Dose VIS Date Route   Pfizer COVID-19 Vaccine 09/12/2019 10:10 AM 0.3 mL 07/20/2018 Intramuscular   Manufacturer: [place]   Lot: [id number]   [place]: [id number]

## 2019-09-13 ENCOUNTER — Other Ambulatory Visit: Payer: Self-pay | Admitting: Family Medicine

## 2019-09-23 ENCOUNTER — Other Ambulatory Visit: Payer: 59

## 2019-09-26 ENCOUNTER — Other Ambulatory Visit: Payer: Self-pay

## 2019-09-26 ENCOUNTER — Other Ambulatory Visit (INDEPENDENT_AMBULATORY_CARE_PROVIDER_SITE_OTHER): Payer: 59

## 2019-09-26 ENCOUNTER — Telehealth: Payer: Self-pay | Admitting: Family Medicine

## 2019-09-26 DIAGNOSIS — E039 Hypothyroidism, unspecified: Secondary | ICD-10-CM | POA: Diagnosis not present

## 2019-09-26 LAB — TSH: TSH: 5.17 u[IU]/mL — ABNORMAL HIGH (ref 0.35–4.50)

## 2019-09-26 MED ORDER — LEVOTHYROXINE SODIUM 137 MCG PO TABS: 137.0000 ug | ORAL_TABLET | Freq: Every day | ORAL | 3 refills | Status: DC

## 2019-09-26 NOTE — Telephone Encounter (Signed)
Patient calling to see if the new script for the levothyroxine has been sent. [id number] [place] . Please Advise

## 2019-09-27 NOTE — Telephone Encounter (Signed)
Called and spoke with pt and advised that Rx has been sent and future lab appointment was made.

## 2019-11-07 ENCOUNTER — Other Ambulatory Visit: Payer: Self-pay

## 2019-11-07 ENCOUNTER — Other Ambulatory Visit (INDEPENDENT_AMBULATORY_CARE_PROVIDER_SITE_OTHER): Payer: 59

## 2019-11-07 ENCOUNTER — Encounter: Payer: Self-pay | Admitting: Family Medicine

## 2019-11-07 DIAGNOSIS — E039 Hypothyroidism, unspecified: Secondary | ICD-10-CM | POA: Diagnosis not present

## 2019-11-07 LAB — TSH: TSH: 5.95 u[IU]/mL — ABNORMAL HIGH (ref 0.35–4.50)

## 2019-11-08 ENCOUNTER — Other Ambulatory Visit: Payer: Self-pay

## 2019-11-08 DIAGNOSIS — E039 Hypothyroidism, unspecified: Secondary | ICD-10-CM

## 2019-11-08 MED ORDER — LEVOTHYROXINE SODIUM 150 MCG PO TABS: 150.0000 ug | ORAL_TABLET | Freq: Every day | ORAL | 3 refills | Status: DC

## 2019-11-14 ENCOUNTER — Encounter: Payer: Self-pay | Admitting: Family Medicine

## 2019-11-27 ENCOUNTER — Encounter: Payer: Self-pay | Admitting: Family Medicine

## 2019-12-12 ENCOUNTER — Encounter (HOSPITAL_BASED_OUTPATIENT_CLINIC_OR_DEPARTMENT_OTHER): Payer: Self-pay | Admitting: *Deleted

## 2019-12-12 ENCOUNTER — Emergency Department (HOSPITAL_BASED_OUTPATIENT_CLINIC_OR_DEPARTMENT_OTHER): Payer: 59

## 2019-12-12 ENCOUNTER — Other Ambulatory Visit: Payer: Self-pay

## 2019-12-12 ENCOUNTER — Emergency Department (HOSPITAL_BASED_OUTPATIENT_CLINIC_OR_DEPARTMENT_OTHER)
Admission: EM | Admit: 2019-12-12 | Discharge: 2019-12-12 | Disposition: A | Discharge status: Home / Self Care | Payer: 59 | Attending: Emergency Medicine | Admitting: Emergency Medicine

## 2019-12-12 ENCOUNTER — Telehealth: Payer: Self-pay

## 2019-12-12 DIAGNOSIS — E039 Hypothyroidism, unspecified: Secondary | ICD-10-CM | POA: Insufficient documentation

## 2019-12-12 DIAGNOSIS — Z87891 Personal history of nicotine dependence: Secondary | ICD-10-CM | POA: Diagnosis not present

## 2019-12-12 DIAGNOSIS — R0600 Dyspnea, unspecified: Secondary | ICD-10-CM | POA: Diagnosis not present

## 2019-12-12 DIAGNOSIS — Z79890 Hormone replacement therapy (postmenopausal): Secondary | ICD-10-CM | POA: Insufficient documentation

## 2019-12-12 DIAGNOSIS — R079 Chest pain, unspecified: Secondary | ICD-10-CM | POA: Diagnosis present

## 2019-12-12 HISTORY — DX: COVID-19: U07.1

## 2019-12-12 LAB — CBC WITH DIFFERENTIAL/PLATELET
Abs Immature Granulocytes: 0.03 10*3/uL (ref 0.00–0.07)
Basophils Absolute: 0.1 10*3/uL (ref 0.0–0.1)
Basophils Relative: 1 %
Eosinophils Absolute: 0.1 10*3/uL (ref 0.0–0.5)
Eosinophils Relative: 1 %
HCT: 46.6 % (ref 39.0–52.0)
Hemoglobin: 15.8 g/dL (ref 13.0–17.0)
Immature Granulocytes: 0 %
Lymphocytes Relative: 22 %
Lymphs Abs: 1.7 10*3/uL (ref 0.7–4.0)
MCH: 29.6 pg (ref 26.0–34.0)
MCHC: 33.9 g/dL (ref 30.0–36.0)
MCV: 87.4 fL (ref 80.0–100.0)
Monocytes Absolute: 0.6 10*3/uL (ref 0.1–1.0)
Monocytes Relative: 8 %
Neutro Abs: 5.1 10*3/uL (ref 1.7–7.7)
Neutrophils Relative %: 68 %
Platelets: 415 10*3/uL — ABNORMAL HIGH (ref 150–400)
RBC: 5.33 MIL/uL (ref 4.22–5.81)
RDW: 13 % (ref 11.5–15.5)
WBC: 7.5 10*3/uL (ref 4.0–10.5)
nRBC: 0 % (ref 0.0–0.2)

## 2019-12-12 LAB — COMPREHENSIVE METABOLIC PANEL
ALT: 31 U/L (ref 0–44)
AST: 28 U/L (ref 15–41)
Albumin: 5.1 g/dL — ABNORMAL HIGH (ref 3.5–5.0)
Alkaline Phosphatase: 62 U/L (ref 38–126)
Anion gap: 10 (ref 5–15)
BUN: 12 mg/dL (ref 6–20)
CO2: 27 mmol/L (ref 22–32)
Calcium: 9.6 mg/dL (ref 8.9–10.3)
Chloride: 102 mmol/L (ref 98–111)
Creatinine, Ser: 1.15 mg/dL (ref 0.61–1.24)
GFR calc Af Amer: >60 mL/min (ref >60)
GFR calc non Af Amer: >60 mL/min (ref >60)
Glucose, Bld: 94 mg/dL (ref 70–99)
Potassium: 4 mmol/L (ref 3.5–5.1)
Sodium: 139 mmol/L (ref 135–145)
Total Bilirubin: 0.6 mg/dL (ref 0.3–1.2)
Total Protein: 9 g/dL — ABNORMAL HIGH (ref 6.5–8.1)

## 2019-12-12 LAB — TROPONIN I (HIGH SENSITIVITY): Troponin I (High Sensitivity): 2 ng/L (ref <18)

## 2019-12-12 MED ORDER — IOHEXOL 350 MG/ML SOLN
100.0000 mL | Freq: Once | INTRAVENOUS | Status: AC
  Administered 2019-12-12: 100 mL via INTRAVENOUS

## 2019-12-12 NOTE — Telephone Encounter (Signed)
Pt spoke with team health and was told to go to ED for his symptoms of elevated heart rate and episode of nearly fainting when walking. Pt declined the advice to go to the ED. He was recommended by team health to be seen in office today, per team health, whom I spoke with. Since Dr. Yong Channel is out of office I reached out to Dr. Marigene Ehlers team. After sharing with his team the patient's story, they also advised him to go to the ED, or Urgent care. I called to let patient know that it was advised that he go to ED or Urgent care per our team at [facility]. He acknowledged understanding. Patient was going to " mull" over what he was going to do, due to not wanting to spend the money to go to the ED for an issue that he did not think was serious.

## 2019-12-12 NOTE — ED Provider Notes (Signed)
[facility] EMERGENCY DEPARTMENT Provider Note   CSN: [id number] Arrival date & time: 12/12/19  1235     History Chief Complaint  Patient presents with  . Chest Pain    Curtis Cox is a 44 y.o. male.  HPI Patient presents with chest pain.  Has had for around the last 2 weeks.  Started shortly after he was diagnosed with Covid.  Also has had his Covid vaccines.  States it is a dull pressure.  Not exertional.  States however when he exerts himself he does have worsening heart rate.  States he feels more fatigued.  States his heart rate goes fast.  States it will come down with rest.  No swelling his legs.  No fevers.  No cough.  States he feels that the Covid is doing better.  Former smoker.  Caught Covid around July 3.    Past Medical History:  Diagnosis Date  . Chicken pox   . COVID-19   . Kidney stones   . Migraines   . Nephrolithiasis   . Thyroid disease     Patient Active Problem List   Diagnosis Date Noted  . Hyperlipidemia, unspecified 08/10/2019  . Hypothyroidism 05/08/2016  . Nephrolithiasis   . Plantar fasciitis 05/07/2011    Past Surgical History:  Procedure Laterality Date  . gamekeepers thumb surgery    . REPLACEMENT DISC ANTERIOR LUMBAR SPINE  2009   L5-S1       Family History  Problem Relation Age of Onset  . Healthy Mother   . Nephrolithiasis Father   . Healthy Sister   . Healthy Brother     Social History   Tobacco Use  . Smoking status: Former Smoker    Packs/day: 0.75    Years: 20.00    Pack years: 15.00    Types: Cigarettes    Quit date: 05/27/2015    Years since quitting: 4.5  . Smokeless tobacco: Never Used  Substance Use Topics  . Alcohol use: Yes    Comment: 2-3 per week  . Drug use: Not on file    Home Medications Prior to Admission medications   Medication Sig Start Date End Date Taking? Authorizing Provider  levothyroxine (SYNTHROID) 137 MCG tablet Take 1 tablet (137 mcg total) by mouth daily before  breakfast. 09/26/19   Marin Olp, MD  levothyroxine (SYNTHROID) 150 MCG tablet Take 1 tablet (150 mcg total) by mouth daily. 11/08/19   Marin Olp, MD    Allergies    Penicillins  Review of Systems   Review of Systems  Constitutional: Negative for appetite change.  HENT: Negative for congestion.   Respiratory: Positive for shortness of breath.   Cardiovascular: Positive for chest pain.  Gastrointestinal: Negative for abdominal pain.  Genitourinary: Negative for flank pain.  Musculoskeletal: Negative for back pain.  Skin: Negative for rash.  Neurological: Negative for weakness.    Physical Exam Updated Vital Signs BP (!) 125/93 (BP Location: Right Arm)   Pulse 83   Temp 97.6 F (36.4 C) (Oral)   Resp 16   Ht 5\' 8"  (1.727 m)   Wt 85.7 kg   SpO2 98%   BMI 28.74 kg/m   Physical Exam Vitals and nursing note reviewed.  HENT:     Head: Atraumatic.  Cardiovascular:     Rate and Rhythm: Regular rhythm.  Pulmonary:     Breath sounds: No wheezing, rhonchi or rales.  Abdominal:     Palpations: There is no hepatomegaly or  splenomegaly.  Musculoskeletal:     Cervical back: Normal range of motion.     Right lower leg: No edema.     Left lower leg: No edema.  Skin:    General: Skin is warm.     Capillary Refill: Capillary refill takes less than 2 seconds.  Neurological:     Mental Status: He is alert and oriented to person, place, and time.     ED Results / Procedures / Treatments   Labs (all labs ordered are listed, but only abnormal results are displayed) Labs Reviewed  CBC WITH DIFFERENTIAL/PLATELET - Abnormal; Notable for the following components:      Result Value   Platelets 415 (*)    All other components within normal limits  COMPREHENSIVE METABOLIC PANEL - Abnormal; Notable for the following components:   Total Protein 9.0 (*)    Albumin 5.1 (*)    All other components within normal limits  TROPONIN I (HIGH SENSITIVITY)    EKG EKG  Interpretation  Date/Time:  Monday December 12 2019 12:47:25 EDT Ventricular Rate:  87 PR Interval:  116 QRS Duration: 86 QT Interval:  336 QTC Calculation: 404 R Axis:   62 Text Interpretation: Normal sinus rhythm Normal ECG Confirmed by Davonna Belling [phone]) on 12/12/2019 3:28:20 PM   Radiology DG Chest 2 View  Result Date: 12/12/2019 CLINICAL DATA:  Midsternal chest pressure for 2 weeks EXAM: CHEST - 2 VIEW COMPARISON:  04/19/2007 FINDINGS: The heart size and mediastinal contours are within normal limits. Both lungs are clear. The visualized skeletal structures are unremarkable. IMPRESSION: No active cardiopulmonary disease. Electronically Signed   By: Davina Poke D.O.   On: 12/12/2019 13:25   CT Angio Chest PE W and/or Wo Contrast  Result Date: 12/12/2019 CLINICAL DATA:  Midsternal chest pressure for 2 weeks, COVID-19 positive 11/26/2019 EXAM: CT ANGIOGRAPHY CHEST WITH CONTRAST TECHNIQUE: Multidetector CT imaging of the chest was performed using the standard protocol during bolus administration of intravenous contrast. Multiplanar CT image reconstructions and MIPs were obtained to evaluate the vascular anatomy. CONTRAST:  134mL OMNIPAQUE IOHEXOL 350 MG/ML SOLN COMPARISON:  100 mL Omnipaque 300 FINDINGS: Cardiovascular: Satisfactory opacification the pulmonary arteries to the segmental level. No pulmonary artery filling defects are identified. Central pulmonary arteries are normal caliber. Normal heart size. No pericardial effusion. Slight reflux of contrast into the IVC. No other major venous abnormalities. The aortic root is suboptimally assessed given cardiac pulsation artifact. Normal thoracic aortic caliber. No acute luminal abnormality of the thoracic aorta. No periaortic stranding or hemorrhage. Normal 3 vessel branching of the aortic arch with normal appearance of the proximal great vessels. Mediastinum/Nodes: No mediastinal fluid or gas. Normal thyroid gland and thoracic inlet. No  acute abnormality of the trachea or esophagus. No worrisome mediastinal, hilar or axillary adenopathy. Lungs/Pleura: Low lung volumes and atelectatic changes likely accentuated by imaging during exhalation for the angiographic technique. No consolidation, features of edema, pneumothorax, or effusion. No suspicious pulmonary nodules or masses. Upper Abdomen: No acute abnormalities present in the visualized portions of the upper abdomen. Musculoskeletal: No acute osseous abnormality or suspicious osseous lesion. Minimal degenerative changes in the spine. No worrisome chest wall lesions. Review of the MIP images confirms the above findings. IMPRESSION: 1. No evidence of pulmonary embolism. 2. No acute intrathoracic process. 3. Slight reflux of contrast into the IVC, can be seen with right heart dysfunction. 4. Low lung volumes and atelectatic changes likely accentuated by imaging during exhalation for the angiographic technique. Electronically Signed  By: Lovena Le M.D.   On: 12/12/2019 16:32    Procedures Procedures (including critical care time)  Medications Ordered in ED Medications  iohexol (OMNIPAQUE) 350 MG/ML injection 100 mL (100 mLs Intravenous Contrast Given 12/12/19 1603)    ED Course  I have reviewed the triage vital signs and the nursing notes.  Pertinent labs & imaging results that were available during my care of the patient were reviewed by me and considered in my medical decision making (see chart for details).    MDM Rules/Calculators/A&P                          Patient presents with shortness of breath chest pressure and tachycardia.  Comes with exertion.  Began after Covid.  Potentially could be still symptoms from the Covid infection.  EKG reassuring.  Troponin negative.  CT does not show pulmonary embolism.  However I think with the potential cardiac involvement of the Covid infection patient may benefit from either follow-up with cardiology or his PCP.  Will discharge  home. Final Clinical Impression(s) / ED Diagnoses Final diagnoses:  Nonspecific chest pain  Dyspnea, unspecified type    Rx / DC Orders ED Discharge Orders    None       Davonna Belling, MD 12/12/19 1708

## 2019-12-12 NOTE — Discharge Instructions (Signed)
Follow-up with Dr. Yong Channel or cardiology.

## 2019-12-12 NOTE — ED Triage Notes (Signed)
Mid sternal chess pressure x 2 weeks  Denies n/v  No sob  Had covid on july3

## 2019-12-12 NOTE — Telephone Encounter (Signed)
Pt went to ED for evaluation

## 2019-12-12 NOTE — Telephone Encounter (Signed)
Nurse Assessment Nurse: Zenia Resides, RN, Diane Date/Time Eilene Ghazi Time): 12/12/2019 10:11:41 AM Confirm and document reason for call. If symptomatic, describe symptoms. ---Caller states he had COVID around 7/3. On 7/5, pt. developed some mild chest pressure that is still there and his heart rate is fluctuating. Resting HR in mid 80's and jumps up to 130 when he stands up and feels woozy also. O2 level is okay. Walked on Saturday and HR shot up to where he though he was going to pass out. Took a long time to recover. Has the patient had close contact with a person known or suspected to have the novel coronavirus illness OR traveled / lives in area with major community spread (including international travel) in the last 14 days from the onset of symptoms? * If Asymptomatic, screen for exposure and travel within the last 14 days. ---Yes Does the patient have any new or worsening symptoms? ---Yes Will a triage be completed? ---Yes Related visit to physician within the last 2 weeks? ---No Does the PT have any chronic conditions? (i.e. diabetes, asthma, this includes High risk factors for pregnancy, etc.) ---Yes List chronic conditions. ---hypothyroid Is this a behavioral health or substance abuse call? ---No Guidelines Guideline Title Affirmed Question Affirmed Notes Nurse Date/Time (Eastern Time) Heart Rate and Heartbeat Questions Patient sounds very sick or weak to the triager Zenia Resides, RN, Diane 12/12/2019 10:17:16 AMPLEASE NOTE: All timestamps contained within this report are represented as [place] Standard Time. CONFIDENTIALTY NOTICE: This fax transmission is intended only for the addressee. It contains information that is legally privileged, confidential or otherwise protected from use or disclosure. If you are not the intended recipient, you are strictly prohibited from reviewing, disclosing, copying using or disseminating any of this information or taking any action in reliance on or  regarding this information. If you have received this fax in error, please notify us immediately by telephone so that we can arrange for its return to [place]. Phone: [phone], Toll-Free: [phone], Fax: [phone] Page: 2 of 2 Call Id: [id number] [place]. Time Eilene Ghazi Time) Disposition Final User 12/12/2019 10:18:56 AM Go to ED Now (or PCP triage) Yes Zenia Resides, RN, Diane Caller Disagree/Comply Disagree Caller Understands Yes PreDisposition Call Doctor Care Advice Given Per Guideline GO TO ED NOW (OR PCP TRIAGE): CARE ADVICE given per Heart Rate and Heartbeat Questions (Adult) guideline. ANOTHER ADULT SHOULD DRIVE:

## 2019-12-12 NOTE — Telephone Encounter (Signed)
Chief Complaint Unclassified Symptom Reason for Call Symptomatic / Request for Health Information Initial Comment Transferred from answering service. PT had Covid recently, still having up and down heart rate. No other symptoms. Translation No Nurse Assessment Nurse: Zenia Resides, RN, Diane Date/Time (Eastern Time): 12/12/2019 10:11:41 AM Confirm and document reason for call. If symptomatic, describe symptoms. ---Caller states he had COVID around 7/3. On 7/5, pt. developed some mild chest pressure that is still there and his heart rate is fluctuating. Resting HR in mid 80's and jumps up to 130 when he stands up and feels woozy also. O2 level is okay. Walked on Saturday and HR shot up to where he though he was going to pass out. Took a long time to recover. Has the patient had close contact with a person known or suspected to have the novel coronavirus illness OR traveled / lives in area with major community spread (including international travel) in the last 14 days from the onset of symptoms? * If Asymptomatic, screen for exposure and travel within the last 14 days. ---Yes Does the patient have any new or worsening symptoms? ---Yes Will a triage be completed? ---Yes Related visit to physician within the last 2 weeks? ---No Does the PT have any chronic conditions? (i.e. diabetes, asthma, this includes High risk factors for pregnancy, etc.) ---Yes List chronic conditions. ---hypothyroid Is this a behavioral health or substance abuse call? ---No Guidelines Guideline Title Affirmed Question Affirmed Notes Nurse Date/Time (Eastern Time) Heart Rate and Heartbeat Questions Patient sounds very sick or weak to the triager Zenia Resides, RN, Diane 12/12/2019 10:17:16 AMPLEASE NOTE: All timestamps contained within this report are represented as [place] Standard Time. CONFIDENTIALTY NOTICE: This fax transmission is intended only for the addressee. It contains information that is legally privileged,  confidential or otherwise protected from use or disclosure. If you are not the intended recipient, you are strictly prohibited from reviewing, disclosing, copying using or disseminating any of this information or taking any action in reliance on or regarding this information. If you have received this fax in error, please notify us immediately by telephone so that we can arrange for its return to [place]. Phone: [phone], Toll-Free: [phone], Fax: [phone] Page: 2 of 2 Call Id: [id number] [place]. Time Eilene Ghazi Time) Disposition Final User 12/12/2019 10:18:56 AM Go to ED Now (or PCP triage) Yes Zenia Resides, RN, Diane Caller Disagree/Comply Disagree Caller Understands Yes PreDisposition Call Doctor Care Advice Given Per Guideline GO TO ED NOW (OR PCP TRIAGE): CARE ADVICE given per Heart Rate and Heartbeat Questions (Adult) guideline. ANOTHER ADULT SHOULD DRIVE: Comments User: Hildred Priest, RN Date/Time (Eastern Time): 12/12/2019 10:27:04 AM Pt. did not want to go to ER. Called back line and they will give pt. a call back about an appt. today. If no appt., pt. will go to UC or ERstressed how important it was to get this checked out ASAP and he verbalized understanding. Referrals Chief Complaint Unclassified Symptom Reason for Call Symptomatic / Request for Health Information Initial Comment Transferred from answering service. PT had Covid recently, still having up and down heart rate. No other symptoms. Translation No Nurse Assessment Nurse: Zenia Resides, RN, Diane Date/Time (Eastern Time): 12/12/2019 10:11:41 AM Confirm and document reason for call. If symptomatic, describe symptoms. ---Caller states he had COVID around 7/3. On 7/5, pt. developed some mild chest pressure that is still there and his heart rate is fluctuating. Resting HR in mid 80's and jumps up to 130 when he stands up and feels woozy also. O2 level  is okay. Walked on Saturday and HR shot up to where he though he was going to pass  out. Took a long time to recover. Has the patient had close contact with a person known or suspected to have the novel coronavirus illness OR traveled / lives in area with major community spread (including international travel) in the last 14 days from the onset of symptoms? * If Asymptomatic, screen for exposure and travel within the last 14 days. ---Yes Does the patient have any new or worsening symptoms? ---Yes Will a triage be completed? ---Yes Related visit to physician within the last 2 weeks? ---No Does the PT have any chronic conditions? (i.e. diabetes, asthma, this includes High risk factors for pregnancy, etc.) ---Yes List chronic conditions. ---hypothyroid Is this a behavioral health or substance abuse call? ---No Guidelines Guideline Title Affirmed Question Affirmed Notes Nurse Date/Time (Eastern Time) Heart Rate and Heartbeat Questions Patient sounds very sick or weak to the triager Zenia Resides, RN, Diane 12/12/2019 10:17:16 AMPLEASE NOTE: All timestamps contained within this report are represented as [place] Standard Time. CONFIDENTIALTY NOTICE: This fax transmission is intended only for the addressee. It contains information that is legally privileged, confidential or otherwise protected from use or disclosure. If you are not the intended recipient, you are strictly prohibited from reviewing, disclosing, copying using or disseminating any of this information or taking any action in reliance on or regarding this information. If you have received this fax in error, please notify us immediately by telephone so that we can arrange for its return to [place]. Phone: [phone], Toll-Free: [phone], Fax: [phone] Page: 2 of 2 Call Id: [id number] [place]. Time Eilene Ghazi Time) Disposition Final User 12/12/2019 10:18:56 AM Go to ED Now (or PCP triage) Yes Zenia Resides, RN, Diane Caller Disagree/Comply Disagree Caller Understands Yes PreDisposition Call Doctor Care Advice Given Per  Guideline GO TO ED NOW (OR PCP TRIAGE): CARE ADVICE given per Heart Rate and Heartbeat Questions (Adult) guideline. ANOTHER ADULT SHOULD DRIVE: Comments User: Hildred Priest, RN Date/Time (Eastern Time): 12/12/2019 10:27:04 AM Pt. did not want to go to ER. Called back line and they will give pt. a call back about an appt. today. If no appt., pt. will go to UC or ERstressed how important it was to get this checked out ASAP and he verbalized understanding. Referrals Warm transfer to backlin

## 2019-12-16 NOTE — Telephone Encounter (Signed)
Needs ED follow up visit

## 2019-12-16 NOTE — Telephone Encounter (Signed)
Please schedule pt for ED f/u.

## 2019-12-19 NOTE — Telephone Encounter (Signed)
Patient has been scheduled

## 2019-12-19 NOTE — Progress Notes (Signed)
Phone [phone] In person visit   Subjective:   Curtis Cox is a 44 y.o. year old very pleasant male patient who presents for/with ED follow up for chest pain. All tests/imaging were normal. See problem oriented charting Chief Complaint  Patient presents with  . ER follow up    chest pain, all imaging/tests normal at ER. States that chest pain has slightly resolved, but still painful when taking deep breaths   This visit occurred during the SARS-CoV-2 public health emergency.  Safety protocols were in place, including screening questions prior to the visit, additional usage of staff PPE, and extensive cleaning of exam room while observing appropriate contact time as indicated for disinfecting solutions.   Past Medical History-  Patient Active Problem List   Diagnosis Date Noted  . Hyperlipidemia, unspecified 08/10/2019    Priority: Medium  . Hypothyroidism 05/08/2016    Priority: Medium  . Nephrolithiasis     Priority: Low  . Plantar fasciitis 05/07/2011    Priority: Low    Medications- reviewed and updated Current Outpatient Medications  Medication Sig Dispense Refill  . levothyroxine (SYNTHROID) 150 MCG tablet Take 1 tablet (150 mcg total) by mouth daily. 90 tablet 3  . levothyroxine (SYNTHROID) 137 MCG tablet Take 1 tablet (137 mcg total) by mouth daily before breakfast. (Patient not taking: Reported on 12/20/2019) 90 tablet 3   No current facility-administered medications for this visit.     Objective:  BP (!) 132/82   Pulse 95   Temp 98.7 F (37.1 C) (Temporal)   Wt 189 lb 12.8 oz (86.1 kg)   SpO2 98%   BMI 28.86 kg/m  Gen: NAD, resting comfortably CV: RRR no murmurs rubs or gallops.  Heart rate is high normal Lungs: CTAB no crackles, wheeze, rhonchi Ext: no edema Skin: warm, dry    Assessment and Plan  # ED f/u for chest discomfort and shortness of breath starting 2 weeks after COVID-19 infection. S: Patient is fully vaccinated from COVID-19 but  unfortunately was diagnosed with COVID-19 around July 3.  Starting around 2 weeks ago sometime after diagnosis with COVID-19-started with dull pressure in his chest that was nonexertional but did notice higher heart rate when exerting himself and feeling more fatigued.  No fever, cough.  He is a former smoker.  In emergency room had an excellent work-up including EKG, negative troponin, CT angiogram without pulmonary embolism.  Due to recent COVID-19 infection-they recommended outpatient follow-up to consider further cardiac work-up with either me or cardiology.  He has continued to have some chest pressure on the left side. If took deep breath yesterday up to 3-4/10. When went to ER was primarily just pressure.  A/P: With ongoing chest discomfort and shortness of breath-I think we need to get an echocardiogram.  I do think this could be related to recent COVID-19 infection.  He does seem to be improving somewhat time  If has ongoing shortness of breath and echocardiogram is reassuring would consider cardiology follow-up for their opinion on stress testing or coronary CT  He also has history of hypothyroidism and I do wonder if this could contribute to fatigue symptoms-we will update TSH with labs today  Recommended follow up: See after visit summary recommendations Future Appointments  Date Time Provider [place]  08/10/2020  8:00 AM Marin Olp, MD [facility]    Lab/Order associations:   ICD-10-CM   1. Dyspnea, unspecified type  R06.00 ECHOCARDIOGRAM COMPLETE    CBC with Differential/Platelet  Comprehensive metabolic panel    Comprehensive metabolic panel    CBC with Differential/Platelet    CANCELED: CBC with Differential/Platelet    CANCELED: Comprehensive metabolic panel    CANCELED: Comprehensive metabolic panel    CANCELED: CBC with Differential/Platelet  2. COVID-19  U07.1   3. Hypothyroidism, unspecified type  E03.9 TSH    TSH    CANCELED: TSH  4.  Encounter for hepatitis C screening test for low risk patient  Z11.59 Hepatitis C antibody    Hepatitis C antibody    CANCELED: Hepatitis C antibody    Time Spent: 21 minutes of total time (1:59 PM- 2:20 PM) was spent on the date of the encounter performing the following actions: chart review prior to seeing the patient, obtaining history, performing a medically necessary exam, counseling on the treatment plan, placing orders, and documenting in our EHR.   Return precautions advised.  Garret Reddish, MD

## 2019-12-20 ENCOUNTER — Other Ambulatory Visit: Payer: Self-pay

## 2019-12-20 ENCOUNTER — Ambulatory Visit (INDEPENDENT_AMBULATORY_CARE_PROVIDER_SITE_OTHER): Payer: 59 | Admitting: Family Medicine

## 2019-12-20 ENCOUNTER — Encounter: Payer: Self-pay | Admitting: Family Medicine

## 2019-12-20 VITALS — BP 132/82 | HR 95 | Temp 98.7°F | Wt 189.8 lb

## 2019-12-20 DIAGNOSIS — Z1159 Encounter for screening for other viral diseases: Secondary | ICD-10-CM

## 2019-12-20 DIAGNOSIS — U071 COVID-19: Secondary | ICD-10-CM

## 2019-12-20 DIAGNOSIS — E039 Hypothyroidism, unspecified: Secondary | ICD-10-CM

## 2019-12-20 DIAGNOSIS — R0600 Dyspnea, unspecified: Secondary | ICD-10-CM

## 2019-12-20 NOTE — Patient Instructions (Addendum)
Health Maintenance Due  Topic Date Due  . Hepatitis C Screening-today with labs Never done   I do think your symptoms could be related to recent COVID-19 but I want to make sure nothing else is going on   Please stop by lab before you go If you have mychart- we will send your results within 3 business days of [place] receiving them.  If you do not have mychart- we will call you about results within 5 business days of [place] receiving them.    We will call you within two weeks about your referral for echocardiogram. If you do not hear within 3 weeks, give [place] a call.   Keep up good job with hydration  Recommended follow up:  Keep march visit or sooner if needed for new or worsening symptoms or symptoms not improving over next month. If worsening chest pain or shortness of breath or palpitations that do not resolve within a few minutes-let us reevaluate or sek care immediately

## 2019-12-21 LAB — COMPREHENSIVE METABOLIC PANEL
AG Ratio: 1.7 (calc) (ref 1.0–2.5)
ALT: 36 U/L (ref 9–46)
AST: 32 U/L (ref 10–40)
Albumin: 4.8 g/dL (ref 3.6–5.1)
Alkaline phosphatase (APISO): 66 U/L (ref 36–130)
BUN: 13 mg/dL (ref 7–25)
CO2: 26 mmol/L (ref 20–32)
Calcium: 10.1 mg/dL (ref 8.6–10.3)
Chloride: 101 mmol/L (ref 98–110)
Creat: 1.13 mg/dL (ref 0.60–1.35)
Globulin: 2.8 g/dL (calc) (ref 1.9–3.7)
Glucose, Bld: 78 mg/dL (ref 65–99)
Potassium: 4 mmol/L (ref 3.5–5.3)
Sodium: 138 mmol/L (ref 135–146)
Total Bilirubin: 0.6 mg/dL (ref 0.2–1.2)
Total Protein: 7.6 g/dL (ref 6.1–8.1)

## 2019-12-21 LAB — CBC WITH DIFFERENTIAL/PLATELET
Absolute Monocytes: 462 cells/uL (ref 200–950)
Basophils Absolute: 68 cells/uL (ref 0–200)
Basophils Relative: 1.2 %
Eosinophils Absolute: 91 cells/uL (ref 15–500)
Eosinophils Relative: 1.6 %
HCT: 42.9 % (ref 38.5–50.0)
Hemoglobin: 14.5 g/dL (ref 13.2–17.1)
Lymphs Abs: 1642 cells/uL (ref 850–3900)
MCH: 29.7 pg (ref 27.0–33.0)
MCHC: 33.8 g/dL (ref 32.0–36.0)
MCV: 87.9 fL (ref 80.0–100.0)
MPV: 9.7 fL (ref 7.5–12.5)
Monocytes Relative: 8.1 %
Neutro Abs: 3437 cells/uL (ref 1500–7800)
Neutrophils Relative %: 60.3 %
Platelets: 420 10*3/uL — ABNORMAL HIGH (ref 140–400)
RBC: 4.88 10*6/uL (ref 4.20–5.80)
RDW: 13.1 % (ref 11.0–15.0)
Total Lymphocyte: 28.8 %
WBC: 5.7 10*3/uL (ref 3.8–10.8)

## 2019-12-21 LAB — TSH: TSH: 0.47 mIU/L (ref 0.40–4.50)

## 2019-12-21 LAB — HEPATITIS C ANTIBODY
Hepatitis C Ab: NONREACTIVE
SIGNAL TO CUT-OFF: 0.04 (ref <1.00)

## 2020-01-03 ENCOUNTER — Other Ambulatory Visit: Payer: Self-pay

## 2020-01-03 ENCOUNTER — Ambulatory Visit (HOSPITAL_COMMUNITY): Payer: 59 | Attending: Cardiology

## 2020-01-03 DIAGNOSIS — R0600 Dyspnea, unspecified: Secondary | ICD-10-CM

## 2020-01-03 LAB — ECHOCARDIOGRAM COMPLETE
Area-P 1/2: 3.27 cm2
S' Lateral: 2.9 cm

## 2020-01-05 ENCOUNTER — Encounter: Payer: Self-pay | Admitting: Family Medicine

## 2020-01-06 ENCOUNTER — Encounter: Payer: Self-pay | Admitting: Family Medicine

## 2020-01-06 DIAGNOSIS — I358 Other nonrheumatic aortic valve disorders: Secondary | ICD-10-CM | POA: Insufficient documentation

## 2020-08-10 ENCOUNTER — Encounter: Payer: 59 | Admitting: Family Medicine

## 2020-08-31 ENCOUNTER — Encounter: Payer: Self-pay | Admitting: Family Medicine

## 2020-09-20 NOTE — Patient Instructions (Addendum)
Health Maintenance Due  Topic Date Due  . COLONOSCOPY (Pts 45-61yrs Insurance coverage will need to be confirmed)  Never done  . COVID-19 Vaccine (3 - Booster for Pfizer series) 03/13/2020   Depression screen PHQ 2/9 08/10/2019  Decreased Interest 0  Down, Depressed, Hopeless 0  PHQ - 2 Score 0

## 2020-09-20 NOTE — Progress Notes (Signed)
We reach out to patient about scheduling a physical with last physical approximately a year ago-I thought that would be a great time to check his thyroid.  He declined to schedule physical but scheduled an office visit to check on thyroid.  When he arrived today apparently he only wanted to have labs drawn and when he was told he was scheduled for an office visit he stated he did not have time for an office visit and left the office.  Patient can certainly be rescheduled for a physical or office visit at date of his choosing.  I am also open to the TSH alone being ordered under hypothyroidism if you would like that now and then we can evaluate full health at physical which would likely be in several months

## 2020-09-21 ENCOUNTER — Encounter: Payer: Self-pay | Admitting: Family Medicine

## 2020-09-21 ENCOUNTER — Other Ambulatory Visit: Payer: Self-pay

## 2020-09-21 ENCOUNTER — Ambulatory Visit (INDEPENDENT_AMBULATORY_CARE_PROVIDER_SITE_OTHER): Payer: 59 | Admitting: Family Medicine

## 2020-09-21 DIAGNOSIS — E039 Hypothyroidism, unspecified: Secondary | ICD-10-CM

## 2020-10-27 ENCOUNTER — Other Ambulatory Visit: Payer: Self-pay | Admitting: Family Medicine

## 2020-11-20 IMAGING — CT CT ANGIO CHEST
2 of 8 series · 18 of 36 positions shown · IV contrast (Omnipaque)
Comparison: 100 mL Omnipaque 300

CLINICAL DATA: Midsternal chest pressure for 2 weeks, [id number]
positive 11/26/2019

EXAM:
CT ANGIOGRAPHY CHEST WITH CONTRAST
TECHNIQUE: Multidetector CT imaging of the chest was performed using the
standard protocol during bolus administration of intravenous
contrast. Multiplanar CT image reconstructions and MIPs were
obtained to evaluate the vascular anatomy.
CONTRAST:  100mL OMNIPAQUE IOHEXOL 350 MG/ML SOLN

[Series 6: pe coronal mpr · coronal · 0.52mm/px · 1 of 136 slices shown]
[im 68/136  mediastinal]
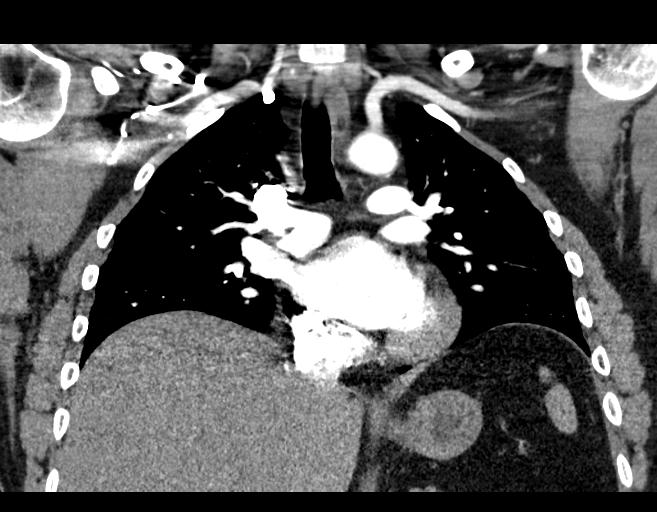

[Series 10: pe thins · axial · 0.67mm/px · z∈[+903,+1112]mm · 17 of 235 slices shown]
[im 13/235  lung]
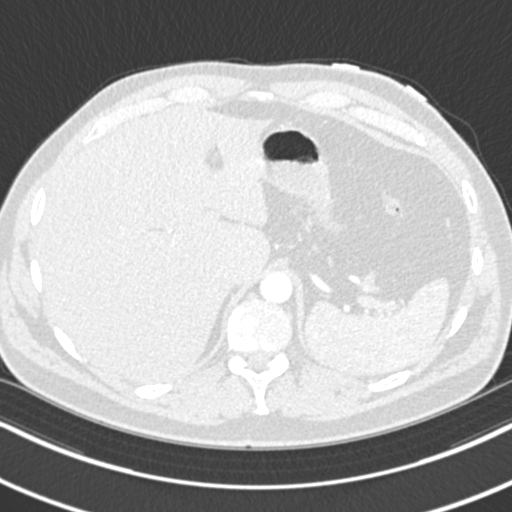
[im 25/235  mediastinal]
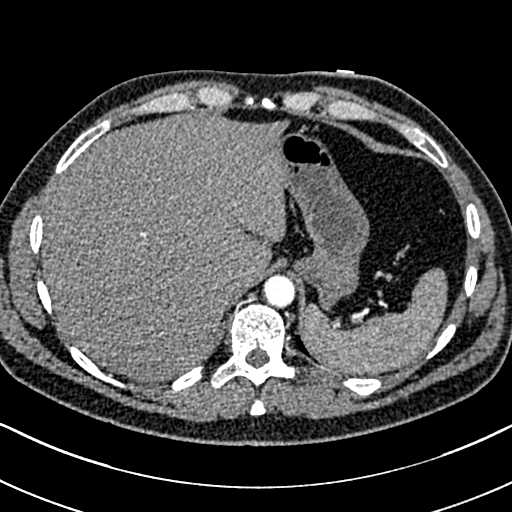
[im 37/235  lung]
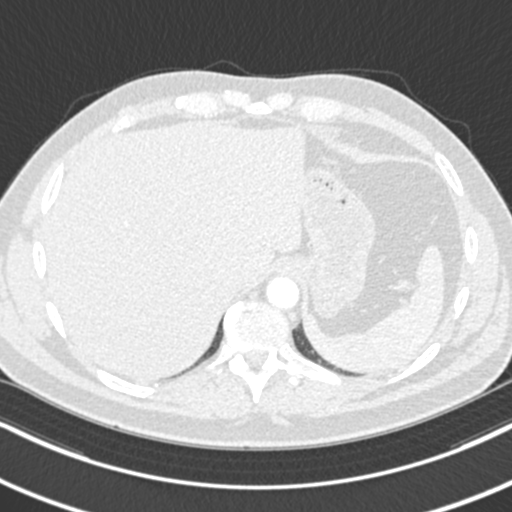
[im 50/235  mediastinal]
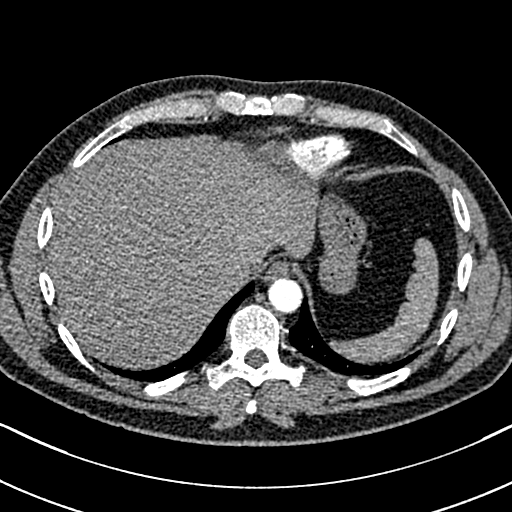
[im 62/235  lung]
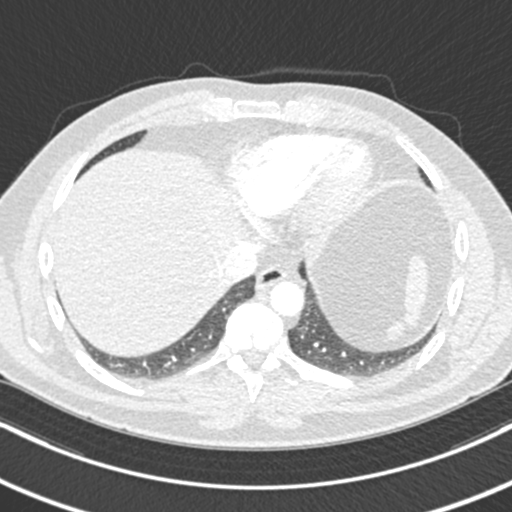
[im 74/235  mediastinal]
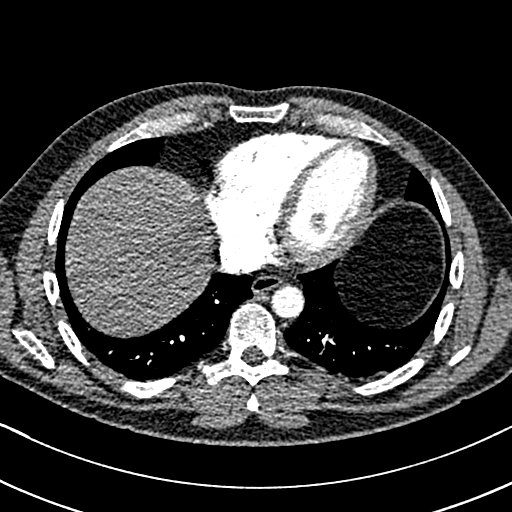
[im 87/235  lung]
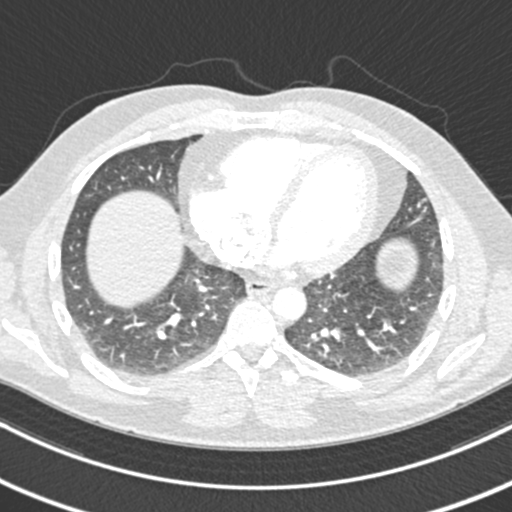
[im 99/235  mediastinal]
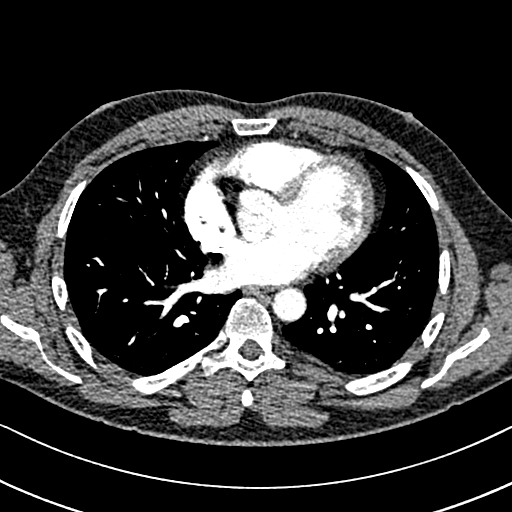
[im 124/235  lung]
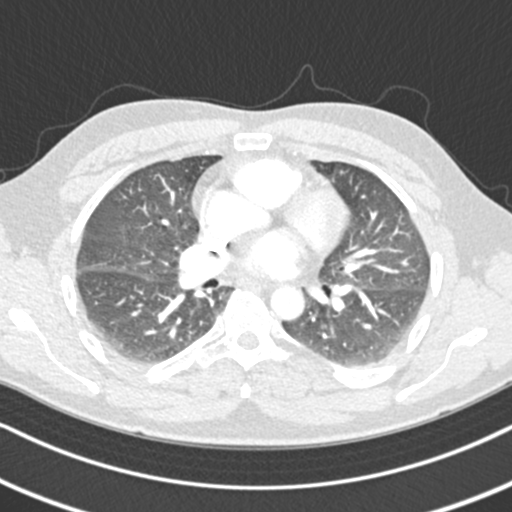
[im 136/235  mediastinal]
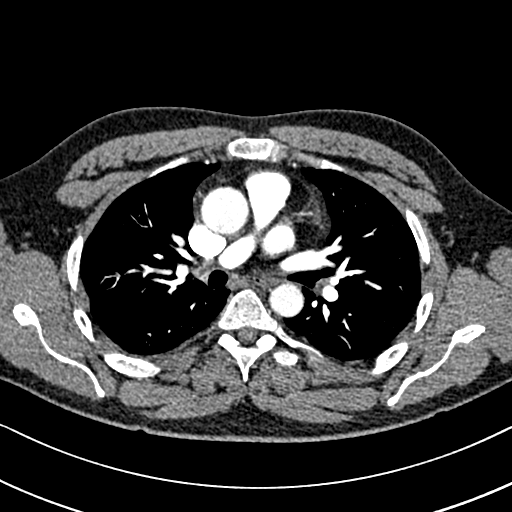
[im 148/235  lung]
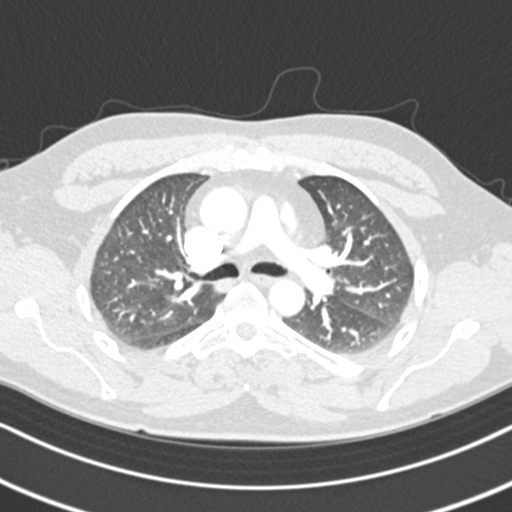
[im 161/235  mediastinal]
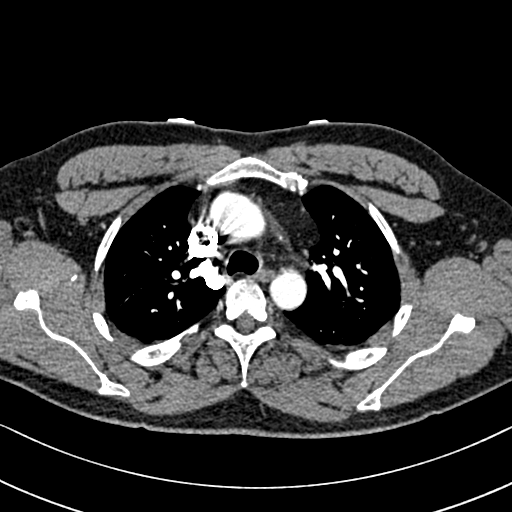
[im 173/235  lung]
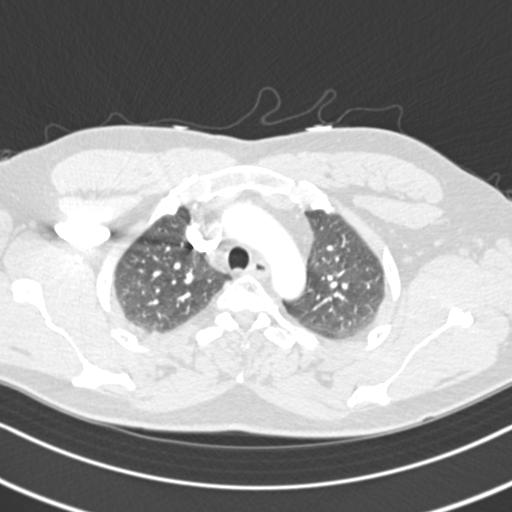
[im 185/235  mediastinal]
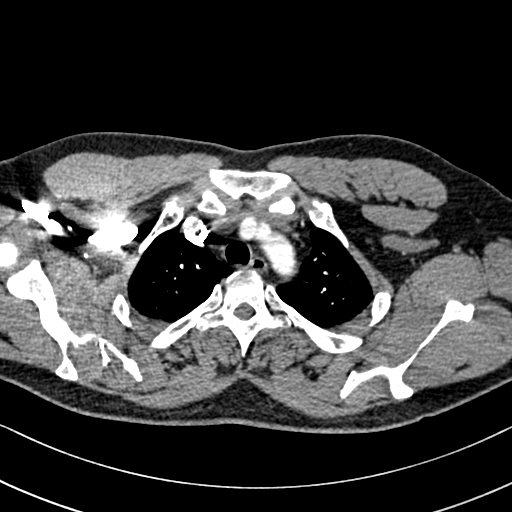
[im 198/235  lung]
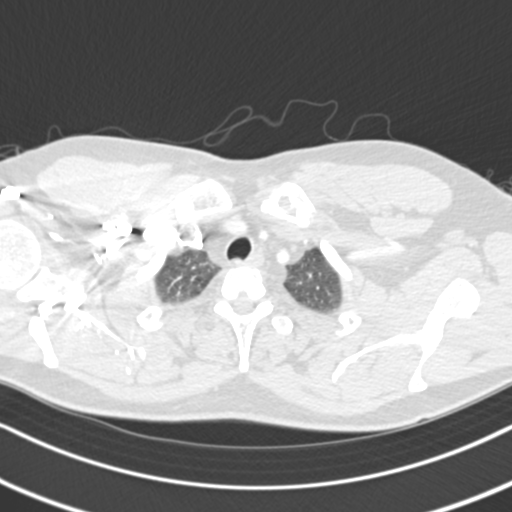
[im 210/235  mediastinal]
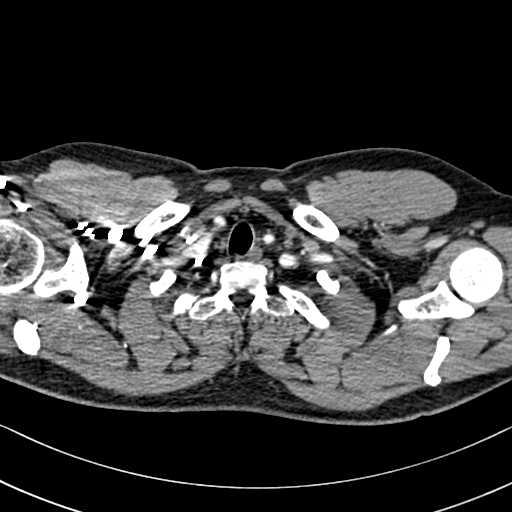
[im 222/235  lung]
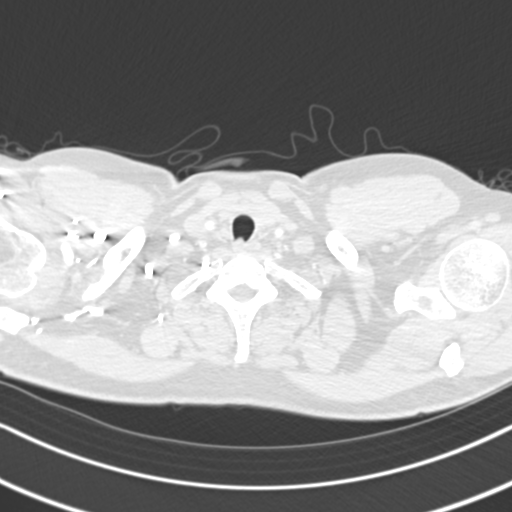

[18 of 36 positions shown; findings below may reference images not displayed]

FINDINGS: Cardiovascular: Satisfactory opacification the pulmonary arteries to
the segmental level. No pulmonary artery filling defects are
identified. Central pulmonary arteries are normal caliber. Normal
heart size. No pericardial effusion. Slight reflux of contrast into
the IVC. No other major venous abnormalities. The aortic root is
suboptimally assessed given cardiac pulsation artifact. Normal
thoracic aortic caliber. No acute luminal abnormality of the
thoracic aorta. No periaortic stranding or hemorrhage. Normal 3
vessel branching of the aortic arch with normal appearance of the
proximal great vessels.

Mediastinum/Nodes: No mediastinal fluid or gas. Normal thyroid gland
and thoracic inlet. No acute abnormality of the trachea or
esophagus. No worrisome mediastinal, hilar or axillary adenopathy.

Lungs/Pleura: Low lung volumes and atelectatic changes likely
accentuated by imaging during exhalation for the angiographic
technique. No consolidation, features of edema, pneumothorax, or
effusion. No suspicious pulmonary nodules or masses.

Upper Abdomen: No acute abnormalities present in the visualized
portions of the upper abdomen.

Musculoskeletal: No acute osseous abnormality or suspicious osseous
lesion. Minimal degenerative changes in the spine. No worrisome
chest wall lesions.

Review of the MIP images confirms the above findings.
IMPRESSION: 1. No evidence of pulmonary embolism.
2. No acute intrathoracic process.
3. Slight reflux of contrast into the IVC, can be seen with right
heart dysfunction.
4. Low lung volumes and atelectatic changes likely accentuated by
imaging during exhalation for the angiographic technique.

## 2021-08-19 NOTE — Progress Notes (Signed)
? ?Phone: [phone] ? ?  ?Subjective:  ?Patient presents today for their annual physical. Chief complaint-noted.  ? ?See problem oriented charting- ?ROS- full  review of systems was completed and negative per full ROS sheet ? ?The following were reviewed and entered/updated in epic: ?Past Medical History:  ?Diagnosis Date  ? Chicken pox   ? COVID-19   ? Kidney stones   ? Migraines   ? Nephrolithiasis   ? Thyroid disease   ? ?Patient Active Problem List  ? Diagnosis Date Noted  ? 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  Priority: Medium   ? Hyperlipidemia, unspecified 08/10/2019  ?  Priority: Medium   ? Hypothyroidism 05/08/2016  ?  Priority: Medium   ? Nephrolithiasis   ?  Priority: Low  ? Plantar fasciitis 05/07/2011  ?  Priority: Low  ? ?Past Surgical History:  ?Procedure Laterality Date  ? gamekeepers thumb surgery    ? REPLACEMENT DISC ANTERIOR LUMBAR SPINE  2009  ? L5-S1  ? ? ?Family History  ?Problem Relation Age of Onset  ? Healthy Mother   ? Nephrolithiasis Father   ? Healthy Sister   ? Healthy Brother   ? ? ?Medications- reviewed and updated ?Current Outpatient Medications  ?Medication Sig Dispense Refill  ? levothyroxine (SYNTHROID) 150 MCG tablet TAKE 1 TABLET BY MOUTH EVERY DAY 90 tablet 3  ? ?No current facility-administered medications for this visit.  ? ? ?Allergies-reviewed and updated ?Allergies  ?Allergen Reactions  ? Penicillins   ? ? ?Social History  ? ?Social History Narrative  ? Divorced 2020. 1 son 56 in 2023- [employer].   ?   ? Works at [employer]. 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 Prior in [employer].   ? ? ?  ?Objective:  ?BP 100/64   Pulse (!) 57   Temp 98 ?F (36.7 ?C)   Ht '5\' 8"'$  (1.727 m)   Wt 186 lb (84.4 kg)   SpO2 97%   BMI 28.28 kg/m?  ?Gen: NAD, resting comfortably ?HEENT: Mucous membranes are moist. Oropharynx normal ?Neck: no thyromegaly ?CV: RRR no murmurs rubs or gallops ?Lungs: CTAB no crackles, wheeze, rhonchi ?Abdomen:  soft/nontender/nondistended/normal bowel sounds. No rebound or guarding.  ?Ext: no edema ?Skin: warm, dry ?Neuro: grossly normal, moves all extremities, PERRLA ? ?   ?Assessment and Plan:  ?46 y.o. male presenting for annual physical.  ?Health Maintenance counseling: ?1. Anticipatory guidance: Patient counseled regarding regular dental exams - needs to update exam- advised q6 months, eye exams-considering updating exam having some reading issues,  avoiding smoking and second hand smoke , limiting alcohol to 2 beverages per day- less than 1 a day. no illicit drugs.   ?2. Risk factor reduction:  Advised patient of need for regular exercise and diet rich and fruits and vegetables to reduce risk of heart attack and stroke.  ?Exercise- not exercising much-lower motivation lately ?Diet/weight management--weight trending down a few lbs- has cut out fried foods for most part and finds that helpful. Tries to eat reasonably healthy but still some unhealthy snacks- want continued regular gradual weight loss  ?Wt Readings from Last 3 Encounters:  ?08/27/21 186 lb (84.4 kg)  ?12/20/19 189 lb 12.8 oz (86.1 kg)  ?12/12/19 189 lb (85.7 kg)  ?3. Immunizations/screenings/ancillary studies ?DISCUSSED:  ?-COVID booster vaccination #3- opts out ?Immunization History  ?Administered Date(s) Administered  ? Influenza,inj,Quad PF,6+ Mos 03/19/2018  ? Influenza-Unspecified 03/15/2016  ? PFIZER(Purple Top)SARS-COV-2 Vaccination 08/18/2019, 09/12/2019  ? Tdap  05/08/2016  ?4. Prostate cancer screening- no family history, start at age 56  ? 42. Colon cancer screening -refer to GI for colonoscopy as over age 71  ?55. Skin cancer screening/prevention- no dermatologist.  advised regular sunscreen use. Denies worrisome, changing, or new skin lesions- flesh colored skin tag on back mildly increasing in size ?7. Testicular cancer screening- advised monthly self exams  ?8. STD screening- patient opts out- has not had unprotected sex and then not been  tested after prior marriage ?9. Smoking associated screening- former smoker-quit 2017 but had 15 pack years ? ?Status of chronic or acute concerns  ? ?#hypothyroidism ?S: He reports still compliant with levothyroxine 150 mcg ?Lab Results  ?Component Value Date  ? TSH 0.47 12/20/2019  ?A/P:  hopefully stable- update tsh today. Continue current meds for now  ? ?#hyperlipidemia ?S: Medication:none. Ascvd risk has been below 3%. Also trying fish oil ?Lab Results  ?Component Value Date  ? CHOL 243 (H) 08/10/2019  ? HDL 40.00 08/10/2019  ? LDLDIRECT 147.0 08/10/2019  ? TRIG 364.0 (H) 08/10/2019  ? CHOLHDL 6 08/10/2019  ? A/P: Update lipid panel and ASCVD risk ? ?#prior SOB after covid 19 a few years ago that persisted. Echo largely reassuring other than aortic valve sclerosis- he reports no chest pain or shortness of breath ? ?Recommended follow up: Return in about 1 year (around 08/28/2022) for physical or sooner if needed.Schedule b4 you leave. ? ?Lab/Order associations: fasting ?  ICD-10-CM   ?1. Preventative health care  Z00.00 Ambulatory referral to Gastroenterology  ?  CBC with Differential/Platelet  ?  Comprehensive metabolic panel  ?  Lipid panel  ?  TSH  ?  POCT Urinalysis Dipstick (Automated)  ?  ?2. Hyperlipidemia, unspecified hyperlipidemia type  E78.5 CBC with Differential/Platelet  ?  Comprehensive metabolic panel  ?  Lipid panel  ?  ?3. Hypothyroidism, unspecified type  E03.9 TSH  ?  ?4. Encounter for screening colonoscopy  Z12.11 Ambulatory referral to Gastroenterology  ?  ?5. Former smoker  Z87.891 POCT Urinalysis Dipstick (Automated)  ?  ? ?No orders of the defined types were placed in this encounter. ? ?Return precautions advised.  ? ?Garret Reddish, MD ? ? ? ?

## 2021-08-27 ENCOUNTER — Ambulatory Visit (INDEPENDENT_AMBULATORY_CARE_PROVIDER_SITE_OTHER): Payer: 59 | Admitting: Family Medicine

## 2021-08-27 ENCOUNTER — Encounter: Payer: Self-pay | Admitting: Family Medicine

## 2021-08-27 VITALS — BP 100/64 | HR 57 | Temp 98.0°F | Ht 68.0 in | Wt 186.0 lb

## 2021-08-27 DIAGNOSIS — E039 Hypothyroidism, unspecified: Secondary | ICD-10-CM

## 2021-08-27 DIAGNOSIS — Z1211 Encounter for screening for malignant neoplasm of colon: Secondary | ICD-10-CM

## 2021-08-27 DIAGNOSIS — E785 Hyperlipidemia, unspecified: Secondary | ICD-10-CM

## 2021-08-27 DIAGNOSIS — Z0000 Encounter for general adult medical examination without abnormal findings: Secondary | ICD-10-CM | POA: Diagnosis not present

## 2021-08-27 DIAGNOSIS — Z87891 Personal history of nicotine dependence: Secondary | ICD-10-CM | POA: Diagnosis not present

## 2021-08-27 LAB — POC URINALSYSI DIPSTICK (AUTOMATED)
Bilirubin, UA: NEGATIVE
Blood, UA: POSITIVE
Glucose, UA: NEGATIVE
Ketones, UA: NEGATIVE
Leukocytes, UA: NEGATIVE
Nitrite, UA: NEGATIVE
Protein, UA: NEGATIVE
Spec Grav, UA: >=1.03 — AB (ref 1.010–1.025)
Urobilinogen, UA: 0.2 E.U./dL
pH, UA: 5 (ref 5.0–8.0)

## 2021-08-27 LAB — CBC WITH DIFFERENTIAL/PLATELET
Basophils Absolute: 0 10*3/uL (ref 0.0–0.1)
Basophils Relative: 0.7 % (ref 0.0–3.0)
Eosinophils Absolute: 0.1 10*3/uL (ref 0.0–0.7)
Eosinophils Relative: 1.9 % (ref 0.0–5.0)
HCT: 45.6 % (ref 39.0–52.0)
Hemoglobin: 16 g/dL (ref 13.0–17.0)
Lymphocytes Relative: 31.2 % (ref 12.0–46.0)
Lymphs Abs: 1.7 10*3/uL (ref 0.7–4.0)
MCHC: 35 g/dL (ref 30.0–36.0)
MCV: 87.1 fl (ref 78.0–100.0)
Monocytes Absolute: 0.3 10*3/uL (ref 0.1–1.0)
Monocytes Relative: 6.1 % (ref 3.0–12.0)
Neutro Abs: 3.2 10*3/uL (ref 1.4–7.7)
Neutrophils Relative %: 60.1 % (ref 43.0–77.0)
Platelets: 389 10*3/uL (ref 150.0–400.0)
RBC: 5.24 Mil/uL (ref 4.22–5.81)
RDW: 13.8 % (ref 11.5–15.5)
WBC: 5.3 10*3/uL (ref 4.0–10.5)

## 2021-08-27 LAB — COMPREHENSIVE METABOLIC PANEL
ALT: 18 U/L (ref 0–53)
AST: 22 U/L (ref 0–37)
Albumin: 5.2 g/dL (ref 3.5–5.2)
Alkaline Phosphatase: 46 U/L (ref 39–117)
BUN: 17 mg/dL (ref 6–23)
CO2: 28 mEq/L (ref 19–32)
Calcium: 10.6 mg/dL — ABNORMAL HIGH (ref 8.4–10.5)
Chloride: 105 mEq/L (ref 96–112)
Creatinine, Ser: 1.24 mg/dL (ref 0.40–1.50)
GFR: 69.69 mL/min (ref >60.00)
Glucose, Bld: 87 mg/dL (ref 70–99)
Potassium: 4.5 mEq/L (ref 3.5–5.1)
Sodium: 142 mEq/L (ref 135–145)
Total Bilirubin: 0.5 mg/dL (ref 0.2–1.2)
Total Protein: 8 g/dL (ref 6.0–8.3)

## 2021-08-27 LAB — LIPID PANEL
Cholesterol: 228 mg/dL — ABNORMAL HIGH (ref 0–200)
HDL: 43.7 mg/dL (ref >39.00)
NonHDL: 183.88
Total CHOL/HDL Ratio: 5
Triglycerides: 243 mg/dL — ABNORMAL HIGH (ref 0.0–149.0)
VLDL: 48.6 mg/dL — ABNORMAL HIGH (ref 0.0–40.0)

## 2021-08-27 LAB — TSH: TSH: 2.66 u[IU]/mL (ref 0.35–5.50)

## 2021-08-27 LAB — LDL CHOLESTEROL, DIRECT: Direct LDL: 146 mg/dL

## 2021-08-27 NOTE — Patient Instructions (Addendum)
[facility] contact ?Please call to schedule visit and/or procedure ?Address: [address], [place], [place] [address] ?Phone: [phone]  ? ?Advise setting up dental visit ? ?Please stop by lab before you go ?If you have mychart- we will send your results within 3 business days of [place] receiving them.  ?If you do not have mychart- we will call you about results within 5 business days of [place] receiving them.  ?*please also note that you will see labs on mychart as soon as they post. I will later go in and write notes on them- will say "notes from Dr. Yong Channel"  ? ? ?Recommended follow up: Return in about 1 year (around 08/28/2022) for physical or sooner if needed.Schedule b4 you leave.  ?

## 2021-08-28 ENCOUNTER — Other Ambulatory Visit: Payer: Self-pay

## 2021-08-28 DIAGNOSIS — R319 Hematuria, unspecified: Secondary | ICD-10-CM

## 2021-08-29 ENCOUNTER — Other Ambulatory Visit (INDEPENDENT_AMBULATORY_CARE_PROVIDER_SITE_OTHER): Payer: 59

## 2021-08-29 DIAGNOSIS — R319 Hematuria, unspecified: Secondary | ICD-10-CM | POA: Diagnosis not present

## 2021-08-29 LAB — URINALYSIS, MICROSCOPIC ONLY

## 2021-10-29 ENCOUNTER — Other Ambulatory Visit: Payer: Self-pay | Admitting: Family Medicine

## 2021-10-29 ENCOUNTER — Ambulatory Visit (AMBULATORY_SURGERY_CENTER): Payer: 59 | Admitting: *Deleted

## 2021-10-29 VITALS — Ht 68.0 in | Wt 186.0 lb

## 2021-10-29 DIAGNOSIS — Z1211 Encounter for screening for malignant neoplasm of colon: Secondary | ICD-10-CM

## 2021-10-29 MED ORDER — NA SULFATE-K SULFATE-MG SULF 17.5-3.13-1.6 GM/177ML PO SOLN: 1.0000 | ORAL | 0 refills | Status: DC

## 2021-10-29 NOTE — Progress Notes (Signed)
Patient's pre-visit was done today over the phone with the patient. Name,DOB and address verified. Patient denies any allergies to Eggs and Soy. Patient denies any problems with anesthesia/sedation. Patient is not taking any diet pills or blood thinners. No home Oxygen. Insurance confirmed with patient.  Prep instructions sent to pt's MyChart -pt is aware. Patient understands to call us back with any questions or concerns. Patient is aware of our care-partner policy. Pt will use good rx.  EMMI education assigned to the patient for the procedure, sent to [place].

## 2021-11-13 ENCOUNTER — Encounter: Payer: Self-pay | Admitting: Gastroenterology

## 2021-11-18 ENCOUNTER — Encounter: Payer: Self-pay | Admitting: Gastroenterology

## 2021-11-18 ENCOUNTER — Ambulatory Visit (AMBULATORY_SURGERY_CENTER): Payer: 59 | Admitting: Gastroenterology

## 2021-11-18 VITALS — BP 124/94 | HR 68 | Temp 97.8°F | Resp 13 | Ht 68.0 in | Wt 186.0 lb

## 2021-11-18 DIAGNOSIS — D124 Benign neoplasm of descending colon: Secondary | ICD-10-CM

## 2021-11-18 DIAGNOSIS — Z1211 Encounter for screening for malignant neoplasm of colon: Secondary | ICD-10-CM | POA: Diagnosis present

## 2021-11-18 MED ORDER — SODIUM CHLORIDE 0.9 % IV SOLN: 500.0000 mL | Freq: Once | INTRAVENOUS | Status: DC

## 2021-11-18 NOTE — Progress Notes (Signed)
GASTROENTEROLOGY PROCEDURE H&P NOTE   Primary Care Physician: Shelva Majestic, MD    Reason for Procedure:  Colon Cancer screening  Plan:    Colonoscopy  Patient is appropriate for endoscopic procedure(s) in the ambulatory ([facility]) setting.  The nature of the procedure, as well as the risks, benefits, and alternatives were carefully and thoroughly reviewed with the patient. Ample time for discussion and questions allowed. The patient understood, was satisfied, and agreed to proceed.     HPI: Curtis Cox is a 46 y.o. male who presents for colonoscopy for routine Colon Cancer screening.  No active GI symptoms.  No known family history of colon cancer or related malignancy.  Patient is otherwise without complaints or active issues today.  Past Medical History:  Diagnosis Date   Allergy    Chicken pox    COVID-19    Kidney stones    Migraines    Nephrolithiasis    Thyroid disease     Past Surgical History:  Procedure Laterality Date   gamekeepers thumb surgery     REPLACEMENT DISC ANTERIOR LUMBAR SPINE  2009   L5-S1    Prior to Admission medications   Medication Sig Start Date End Date Taking? Authorizing Provider  cetirizine (ZYRTEC) 10 MG tablet Take 10 mg by mouth daily.   Yes [provider]  Cholecalciferol (VITAMIN D3 PO) Take by mouth.   Yes [provider]  Cyanocobalamin (VITAMIN B-12 PO) Take by mouth.   Yes [provider]  fexofenadine (ALLEGRA) 180 MG tablet Take 180 mg by mouth daily.   Yes [provider]  levothyroxine (SYNTHROID) 150 MCG tablet TAKE 1 TABLET BY MOUTH EVERY DAY 10/29/21  Yes Shelva Majestic, MD  Misc Natural Products (FOCUSED MIND PO) Take by mouth.   Yes [provider]  Multiple Vitamin (MULTIVITAMIN) tablet Take 1 tablet by mouth daily.   Yes [provider]  Omega-3 Fatty Acids (FISH OIL PO) Take by mouth.   Yes [provider]    Current Outpatient  Medications  Medication Sig Dispense Refill   cetirizine (ZYRTEC) 10 MG tablet Take 10 mg by mouth daily.     Cholecalciferol (VITAMIN D3 PO) Take by mouth.     Cyanocobalamin (VITAMIN B-12 PO) Take by mouth.     fexofenadine (ALLEGRA) 180 MG tablet Take 180 mg by mouth daily.     levothyroxine (SYNTHROID) 150 MCG tablet TAKE 1 TABLET BY MOUTH EVERY DAY 90 tablet 3   Misc Natural Products (FOCUSED MIND PO) Take by mouth.     Multiple Vitamin (MULTIVITAMIN) tablet Take 1 tablet by mouth daily.     Omega-3 Fatty Acids (FISH OIL PO) Take by mouth.     Current Facility-Administered Medications  Medication Dose Route Frequency Provider Last Rate Last Admin   0.9 %  sodium chloride infusion  500 mL Intravenous Once Carols Clemence V, DO        Allergies as of 11/18/2021 - Review Complete 11/18/2021  Allergen Reaction Noted   Penicillins  05/05/2011    Family History  Problem Relation Age of Onset   Healthy Mother    Nephrolithiasis Father    Healthy Sister    Healthy Brother    Colon cancer Neg Hx    Esophageal cancer Neg Hx    Rectal cancer Neg Hx    Stomach cancer Neg Hx     Social History   Socioeconomic History   Marital status: Divorced    Spouse  name: Not on file   Number of children: Not on file   Years of education: Not on file   Highest education level: Not on file  Occupational History   Not on file  Tobacco Use   Smoking status: Former    Packs/day: 0.75    Years: 20.00    Total pack years: 15.00    Types: Cigarettes    Quit date: 05/27/2015    Years since quitting: 6.4   Smokeless tobacco: Never  Vaping Use   Vaping Use: Never used  Substance and Sexual Activity   Alcohol use: Yes    Comment: occ   Drug use: Not Currently   Sexual activity: Not on file  Other Topics Concern   Not on file  Social History Narrative   Divorced 2020. 1 son 60 in 2023- [employer].       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 HS education    Prior in [place].    Social Determinants of Health   Financial Resource Strain: Not on file  Food Insecurity: Not on file  Transportation Needs: Not on file  Physical Activity: Not on file  Stress: Not on file  Social Connections: Not on file  Intimate Partner Violence: Not on file    Physical Exam: Vital signs in last 24 hours: @BP  119/84   Pulse 82   Temp 97.8 F (36.6 C) (Skin)   Ht 5\' 8"  (1.727 m)   Wt 186 lb (84.4 kg)   SpO2 100%   BMI 28.28 kg/m  GEN: NAD EYE: Sclerae anicteric ENT: MMM CV: Non-tachycardic Pulm: CTA b/l GI: Soft, NT/ND NEURO:  Alert & Oriented x 3   Doristine Locks, DO [facility]   11/18/2021 9:31 AM

## 2021-11-18 NOTE — Progress Notes (Signed)
Called to room to assist during endoscopic procedure.  Patient ID and intended procedure confirmed with present staff. Received instructions for my participation in the procedure from the performing physician.  

## 2021-11-19 ENCOUNTER — Telehealth: Payer: Self-pay | Admitting: *Deleted

## 2021-11-19 NOTE — Telephone Encounter (Signed)
Post procedure follow up call placed and left VM.

## 2021-12-10 ENCOUNTER — Encounter: Payer: Self-pay | Admitting: Gastroenterology

## 2022-02-17 ENCOUNTER — Encounter: Payer: Self-pay | Admitting: *Deleted

## 2022-05-08 ENCOUNTER — Encounter: Payer: Self-pay | Admitting: *Deleted

## 2022-05-20 ENCOUNTER — Ambulatory Visit
Admission: EM | Admit: 2022-05-20 | Discharge: 2022-05-20 | Disposition: A | Discharge status: Home / Self Care | Payer: 59 | Attending: Emergency Medicine | Admitting: Emergency Medicine

## 2022-05-20 ENCOUNTER — Encounter: Payer: Self-pay | Admitting: Family Medicine

## 2022-05-20 DIAGNOSIS — U071 COVID-19: Secondary | ICD-10-CM

## 2022-05-20 MED ORDER — MOLNUPIRAVIR EUA 200MG CAPSULE: 4.0000 | ORAL_CAPSULE | Freq: Two times a day (BID) | ORAL | 0 refills | Status: AC

## 2022-05-20 NOTE — Discharge Instructions (Addendum)
Take the molnupiravir as directed.    Take Tylenol or ibuprofen as needed for fever or discomfort.  Rest and keep yourself hydrated.    Follow-up with your primary care provider if your symptoms are not improving.

## 2022-05-20 NOTE — ED Provider Notes (Signed)
[facility]    CSN: [id number] Arrival date & time: 05/20/22  1335      History   Chief Complaint Chief Complaint  Patient presents with   Covid Positive    HPI Bowden Boody is a 46 y.o. male.  Patient presents with 3 day history of chills, fatigue, body aches, headache, congestion, cough.  He tested positive for COVID at home yesterday.  He denies fever, rash, sore throat, shortness of breath, vomiting, diarrhea, or other symptoms.  Treatment at home with OTC cold and flu medication.  His medical history include kidney stones, hypothyroidism, hyperlipidemia, aortic valve sclerosis.  The history is provided by the patient and medical records.    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 Authorizing Provider  molnupiravir EUA (LAGEVRIO) 200 mg CAPS capsule Take 4 capsules (800 mg total) by mouth 2 (two) times daily for 5 days. 05/20/22 05/25/22 Yes Sharion Balloon, NP  cetirizine (ZYRTEC) 10 MG tablet Take 10 mg by mouth daily.    [provider]  Cholecalciferol (VITAMIN D3 PO) Take by mouth.    [provider]  Cyanocobalamin (VITAMIN B-12 PO) Take by mouth.    [provider]  fexofenadine (ALLEGRA) 180 MG tablet Take 180 mg by mouth daily.    [provider]  levothyroxine (SYNTHROID) 150 MCG tablet TAKE 1 TABLET BY MOUTH EVERY DAY 10/29/21   Marin Olp, MD  Misc Natural Products (FOCUSED MIND PO) Take by mouth.    [provider]  Multiple Vitamin (MULTIVITAMIN) tablet Take 1 tablet by mouth daily.    [provider]  Omega-3 Fatty Acids (FISH OIL PO) Take by mouth.    [provider]    Family History Family History  Problem Relation Age of Onset   Healthy Mother    Nephrolithiasis Father    Healthy Sister    Healthy Brother    Colon cancer Neg Hx    Esophageal cancer Neg Hx    Rectal cancer Neg Hx    Stomach cancer Neg Hx     Social History Social History   Tobacco Use   Smoking status: Former    Packs/day: 0.75    Years: 20.00    Total pack years: 15.00    Types: Cigarettes    Quit date: 05/27/2015    Years since quitting: 6.9   Smokeless tobacco: Never  Vaping Use   Vaping Use: Never used  Substance Use Topics   Alcohol use: Yes    Comment: occ   Drug use: Not Currently     Allergies   Penicillins   Review of Systems Review of Systems  Constitutional:  Positive for chills and fatigue. Negative for fever.  HENT:  Positive for congestion. Negative for ear pain and sore  throat.   Respiratory:  Positive for cough. Negative for shortness of breath.   Cardiovascular:  Negative for chest pain and palpitations.  Gastrointestinal:  Negative for diarrhea and vomiting.  Skin:  Negative for color change and rash.  Neurological:  Positive for headaches.  All other systems reviewed and are negative.    Physical Exam Triage Vital Signs ED Triage Vitals  Enc Vitals Group     BP 05/20/22 1506 135/82     Pulse Rate 05/20/22 1506 78     Resp 05/20/22 1506 17     Temp 05/20/22 1506 98 F (36.7 C)     Temp src --      SpO2 05/20/22 1506 98 %     Weight 05/20/22 1504 180 lb (81.6 kg)     Height 05/20/22 1504 '5\' 8"'$  (1.727 m)     Head Circumference --      Peak Flow --      Pain Score 05/20/22 1503 3     Pain Loc --      Pain Edu? --      Excl. in [place]? --    No data  found.  Updated Vital Signs BP 135/82   Pulse 78   Temp 98 F (36.7 C)   Resp 17   Ht '5\' 8"'$  (1.727 m)   Wt 180 lb (81.6 kg)   SpO2 98%   BMI 27.37 kg/m   Visual Acuity Right Eye Distance:   Left Eye Distance:   Bilateral Distance:    Right Eye Near:   Left Eye Near:    Bilateral Near:     Physical Exam Vitals and nursing note reviewed.  Constitutional:      General: He is not in acute distress.    Appearance: Normal appearance. He is well-developed. He is ill-appearing.  HENT:     Right Ear: Tympanic membrane normal.     Left Ear: Tympanic membrane normal.     Nose: Nose normal.     Mouth/Throat:     Mouth: Mucous membranes are moist.     Pharynx: Oropharynx is clear.  Cardiovascular:     Rate and Rhythm: Normal rate and regular rhythm.     Heart sounds: Normal heart sounds.  Pulmonary:     Effort: Pulmonary effort is normal. No respiratory distress.     Breath sounds: Normal breath sounds.  Musculoskeletal:     Cervical back: Neck supple.  Skin:    General: Skin is warm and dry.  Neurological:     Mental Status: He is alert.  Psychiatric:        Mood and Affect: Mood normal.        Behavior: Behavior normal.      UC Treatments / Results  Labs (all labs ordered are listed, but only abnormal results are displayed) Labs Reviewed - No data to display  EKG   Radiology No results found.  Procedures Procedures (including critical care time)  Medications Ordered in UC Medications - No data to display  Initial Impression / Assessment and Plan / UC Course  I have reviewed the triage vital signs and the nursing notes.  Pertinent labs & imaging results that were available during my care of the patient were reviewed by me and considered in my medical decision making (see chart for details).    COVID-19.  Symptom onset on 05/18/2022.  Tested positive for COVID at home yesterday.  No renal function labs in the past 6 months.  Treating  with molnupiravir.   Discussed that this is an emergency authorized medication for treatment of COVID.  Discussed side effects including nausea, diarrhea, dizziness.  Discussed other symptomatic treatment including Tylenol, rest, hydration.  Instructed patient to follow-up with PCP if symptoms are not improving.  ED precautions discussed.  Patient agrees to plan of care.   Final Clinical Impressions(s) / UC Diagnoses   Final diagnoses:  [id number]     Discharge Instructions      Take the molnupiravir as directed.    Take Tylenol or ibuprofen as needed for fever or discomfort.  Rest and keep yourself hydrated.    Follow-up with your primary care provider if your symptoms are not improving.         ED Prescriptions     Medication Sig Dispense Auth. Provider   molnupiravir EUA (LAGEVRIO) 200 mg CAPS capsule Take 4 capsules (800 mg total) by mouth 2 (two) times daily for 5 days. 40 capsule Sharion Balloon, NP      PDMP not reviewed this encounter.   Sharion Balloon, NP 05/20/22 [phone]

## 2022-05-20 NOTE — ED Triage Notes (Signed)
Patient to Urgent Care with complaints of URI.  Sunday- chills/ body aches/ headaches/ congestion. Yesterday feeling worse, overall fatigue. Loss of taste.  Home covid test positive yesterday.  Denies any known fevers.   Has been taking alka-selzter cold and flu.

## 2022-10-14 ENCOUNTER — Emergency Department (HOSPITAL_COMMUNITY): Payer: 59

## 2022-10-14 ENCOUNTER — Other Ambulatory Visit: Payer: Self-pay

## 2022-10-14 ENCOUNTER — Emergency Department (HOSPITAL_COMMUNITY)
Admission: EM | Admit: 2022-10-14 | Discharge: 2022-10-14 | Disposition: A | Discharge status: Home / Self Care | Payer: 59 | Attending: Emergency Medicine | Admitting: Emergency Medicine

## 2022-10-14 DIAGNOSIS — S61219A Laceration without foreign body of unspecified finger without damage to nail, initial encounter: Secondary | ICD-10-CM | POA: Diagnosis not present

## 2022-10-14 DIAGNOSIS — S62619B Displaced fracture of proximal phalanx of unspecified finger, initial encounter for open fracture: Secondary | ICD-10-CM | POA: Insufficient documentation

## 2022-10-14 DIAGNOSIS — W231XXA Caught, crushed, jammed, or pinched between stationary objects, initial encounter: Secondary | ICD-10-CM | POA: Diagnosis not present

## 2022-10-14 DIAGNOSIS — S6992XA Unspecified injury of left wrist, hand and finger(s), initial encounter: Secondary | ICD-10-CM | POA: Diagnosis present

## 2022-10-14 MED ORDER — HYDROCODONE-ACETAMINOPHEN 5-325 MG PO TABS
1.0000 | ORAL_TABLET | Freq: Once | ORAL | Status: AC
  Administered 2022-10-14: 1 via ORAL
  Filled 2022-10-14: qty 1

## 2022-10-14 MED ORDER — HYDROCODONE-ACETAMINOPHEN 5-325 MG PO TABS: 1.0000 | ORAL_TABLET | Freq: Four times a day (QID) | ORAL | 0 refills | Status: DC | PRN

## 2022-10-14 MED ORDER — NAPROXEN 375 MG PO TABS: 375.0000 mg | ORAL_TABLET | Freq: Two times a day (BID) | ORAL | 0 refills | Status: DC

## 2022-10-14 MED ORDER — TETANUS-DIPHTH-ACELL PERTUSSIS 5-2.5-18.5 LF-MCG/0.5 IM SUSY
0.5000 mL | PREFILLED_SYRINGE | Freq: Once | INTRAMUSCULAR | Status: DC
  Filled 2022-10-14: qty 0.5

## 2022-10-14 MED ORDER — PROMETHAZINE HCL 25 MG PO TABS
25.0000 mg | ORAL_TABLET | Freq: Once | ORAL | Status: AC
  Administered 2022-10-14: 25 mg via ORAL
  Filled 2022-10-14: qty 1

## 2022-10-14 MED ORDER — LIDOCAINE HCL (PF) 1 % IJ SOLN
30.0000 mL | Freq: Once | INTRAMUSCULAR | Status: AC
  Administered 2022-10-14: 30 mL
  Filled 2022-10-14: qty 30

## 2022-10-14 MED ORDER — HYDROMORPHONE HCL 1 MG/ML IJ SOLN
1.0000 mg | Freq: Once | INTRAMUSCULAR | Status: AC
  Administered 2022-10-14: 1 mg via INTRAVENOUS

## 2022-10-14 MED ORDER — CEFUROXIME AXETIL 500 MG PO TABS: 500.0000 mg | ORAL_TABLET | Freq: Two times a day (BID) | ORAL | 0 refills | Status: AC

## 2022-10-14 MED ORDER — HYDROMORPHONE HCL 1 MG/ML IJ SOLN
1.0000 mg | Freq: Once | INTRAMUSCULAR | Status: DC
  Filled 2022-10-14: qty 1

## 2022-10-14 MED ORDER — SODIUM CHLORIDE 0.9 % IV SOLN
1.0000 g | Freq: Once | INTRAVENOUS | Status: AC
  Administered 2022-10-14: 1 g via INTRAVENOUS
  Filled 2022-10-14: qty 10

## 2022-10-14 NOTE — ED Provider Notes (Signed)
[place] EMERGENCY DEPARTMENT AT [facility] Provider Note   CSN: [id number] Arrival date & time: 10/14/22  1056     History  Chief complaint: Hand injury  Curtis Cox is a 47 y.o. male.  HPI   Patient presents to the ED for evaluation of a hand injury.  Patient states he was working on an Production [employer] when he accidentally got his hand caught in a fan.  Patient sustained lacerations to his fingers.  Patient is able to move all of his digits.  He is denying any numbness or weakness.  Home Medications Prior to Admission medications   Medication Sig Start Date End Date Taking? Authorizing Provider  cefUROXime (CEFTIN) 500 MG tablet Take 1 tablet (500 mg total) by mouth 2 (two) times daily with a meal for 10 days. 10/14/22 10/24/22 Yes Linwood Dibbles, MD  HYDROcodone-acetaminophen (NORCO/VICODIN) 5-325 MG tablet Take 1 tablet by mouth every 6 (six) hours as needed. 10/14/22  Yes Linwood Dibbles, MD  naproxen (NAPROSYN) 375 MG tablet Take 1 tablet (375 mg total) by mouth 2 (two) times daily. 10/14/22  Yes Linwood Dibbles, MD  cetirizine (ZYRTEC) 10 MG tablet Take 10 mg by mouth daily.    [provider]  Cholecalciferol (VITAMIN D3 PO) Take by mouth.    [provider]  Cyanocobalamin (VITAMIN B-12 PO) Take by mouth.    [provider]  fexofenadine (ALLEGRA) 180 MG tablet Take 180 mg by mouth daily.    [provider]  levothyroxine (SYNTHROID) 150 MCG tablet TAKE 1 TABLET BY MOUTH EVERY DAY 10/29/21   Shelva Majestic, MD  Misc Natural Products (FOCUSED MIND PO) Take by mouth.    [provider]  Multiple Vitamin (MULTIVITAMIN) tablet Take 1 tablet by mouth daily.    [provider]  Omega-3 Fatty Acids (FISH OIL PO) Take by mouth.    [provider]      Allergies    Penicillins    Review of Systems   Review of Systems  Physical Exam Updated Vital Signs BP 110/69 (BP Location: Right Arm)   Pulse 70   Temp 98.3 F  (36.8 C) (Oral)   Resp 16   Ht 1.727 m (5\' 8" )   Wt 80.7 kg   SpO2 100%   BMI 27.06 kg/m  Physical Exam Vitals and nursing note reviewed.  Constitutional:      General: He is not in acute distress.    Appearance: He is well-developed.  HENT:     Head: Normocephalic and atraumatic.     Right Ear: External ear normal.     Left Ear: External ear normal.  Eyes:     General: No scleral icterus.       Right eye: No discharge.        Left eye: No discharge.     Conjunctiva/sclera: Conjunctivae normal.  Neck:     Trachea: No tracheal deviation.  Cardiovascular:     Rate and Rhythm: Normal rate.  Pulmonary:     Effort: Pulmonary effort is normal. No respiratory distress.     Breath sounds: No stridor.  Abdominal:     General: There is no distension.  Musculoskeletal:        General: No swelling or deformity.     Cervical back: Neck supple.  Skin:    General: Skin is warm and dry.     Findings: No rash.     Comments: Lacerations noted on the digits of his left  hand, no deformity noted to the digits, no visible bone, full range of motion to flexion and extension, normal cap refill, normal sensation  Neurological:     Mental Status: He is alert. Mental status is at baseline.     Cranial Nerves: No dysarthria or facial asymmetry.     Motor: No seizure activity.        ED Results / Procedures / Treatments   Labs (all labs ordered are listed, but only abnormal results are displayed) Labs Reviewed - No data to display  EKG None  Radiology DG Hand Complete Left  Result Date: 10/14/2022 CLINICAL DATA:  Cut left index finger on an engine fan at work this morning. EXAM: LEFT HAND - COMPLETE 3+ VIEW COMPARISON:  None Available. FINDINGS: Acute fracture along the ulnar base of the second proximal phalanx with associated soft tissue injury. Fracture fragments extend near the skin surface. No additional fracture. No dislocation. Old healed fracture of the ulnar base of the first  proximal phalanx status post cerclage wire fixation. Joint spaces are preserved. Bone mineralization is normal. IMPRESSION: 1. Acute open fracture of the second proximal phalanx with associated soft tissue injury. Electronically Signed   By: Obie Dredge M.D.   On: 10/14/2022 13:40    Procedures Procedures    Medications Ordered in ED Medications  Tdap (BOOSTRIX) injection 0.5 mL (has no administration in time range)  lidocaine (PF) (XYLOCAINE) 1 % injection 30 mL (has no administration in time range)  cefTRIAXone (ROCEPHIN) 1 g in sodium chloride 0.9 % 100 mL IVPB (has no administration in time range)  HYDROmorphone (DILAUDID) injection 1 mg (has no administration in time range)  HYDROcodone-acetaminophen (NORCO/VICODIN) 5-325 MG per tablet 1 tablet (1 tablet Oral Given 10/14/22 1229)  promethazine (PHENERGAN) tablet 25 mg (25 mg Oral Given 10/14/22 1229)    ED Course/ Medical Decision Making/ A&P Clinical Course as of 10/14/22 1546  Tue Oct 14, 2022  1201 Patient is requesting a Phenergan tablet for nausea [JK]  1506 Open phalanx fracture  [CC]    Clinical Course User Index [CC] Glyn Ade, MD [JK] Linwood Dibbles, MD                             Medical Decision Making Problems Addressed: Laceration of finger of left hand without foreign body without damage to nail, unspecified finger, initial encounter: acute illness or injury that poses a threat to life or bodily functions Open displaced fracture of proximal phalanx of finger, unspecified finger, initial encounter: acute illness or injury that poses a threat to life or bodily functions  Amount and/or Complexity of Data Reviewed Radiology: ordered and independent interpretation performed.  Risk Prescription drug management. Parenteral controlled substances.   Patient presented to ED with several lacerations to his hand following an injury with motor fan.  Patient does have evidence of a fracture to the second proximal  phalanx.  This is an open fracture.  We consulted with orthopedics.  Earney Hamburg evaluated the patient ED.  Recommendation was for local wound care, drain, outpatient follow-up with orthopedics.  Patient did have a bone fragment that was interfering with the suturing.  Plan will be to repair that wound with ortho assistance at the bedside.  And was given IV pain medications antibiotics.  Plan on outpatient follow-up with orthopedics       Final Clinical Impression(s) / ED Diagnoses Final diagnoses:  Laceration of finger of left hand  without foreign body without damage to nail, unspecified finger, initial encounter  Open displaced fracture of proximal phalanx of finger, unspecified finger, initial encounter    Rx / DC Orders ED Discharge Orders          Ordered    HYDROcodone-acetaminophen (NORCO/VICODIN) 5-325 MG tablet  Every 6 hours PRN        10/14/22 1545    naproxen (NAPROSYN) 375 MG tablet  2 times daily        10/14/22 1545    cefUROXime (CEFTIN) 500 MG tablet  2 times daily with meals        10/14/22 1545              Linwood Dibbles, MD 10/14/22 1549

## 2022-10-14 NOTE — ED Provider Notes (Signed)
..  Laceration Repair  Date/Time: 10/14/2022 4:18 PM  Performed by: Netta Corrigan, PA-C Authorized by: Netta Corrigan, PA-C   Consent:    Consent obtained:  Verbal   Consent given by:  Patient   Risks, benefits, and alternatives were discussed: yes     Risks discussed:  Infection, need for additional repair, nerve damage, pain, poor cosmetic result and poor wound healing Universal protocol:    Imaging studies available: yes     Patient identity confirmed:  Verbally with patient Anesthesia:    Anesthesia method:  Nerve block   Block anesthetic:  Lidocaine 1% w/o epi   Block outcome:  Anesthesia achieved Laceration details:    Location:  Finger   Finger location:  L index finger   Length (cm):  2   Depth (mm):  2 Treatment:    Area cleansed with:  Saline   Amount of cleaning:  Standard   Irrigation solution:  Sterile saline   Irrigation volume:  100   Irrigation method:  Pressure wash   Debridement:  None Skin repair:    Repair method:  Sutures   Suture size:  4-0   Suture material:  Prolene   Suture technique:  Simple interrupted   Number of sutures:  6 Approximation:    Approximation:  Close Repair type:    Repair type:  Simple Post-procedure details:    Dressing:  Open (no dressing)   Procedure completion:  Tami Lin, PA-C 10/14/22 1621    Linwood Dibbles, MD 10/15/22 [phone]

## 2022-10-14 NOTE — ED Notes (Signed)
Patient transported to X-ray at this time 

## 2022-10-14 NOTE — Discharge Instructions (Addendum)
Take the antibiotics as prescribed.  Take the pain medications as needed.  Follow-up with the orthopedic doctor in the office later this week.  Call the office to schedule the appointment time.

## 2022-10-14 NOTE — Consult Note (Signed)
Reason for Consult:Left hand lacs Referring Physician: Linwood Dibbles Time called: 1452 Time at bedside: 1453   Curtis Cox is an 47 y.o. male.  HPI: Shontez got his hand caught by a fan motor. He suffered multiple lacerations and came to the ED for evaluation. EDPA had difficulty suturing around a bone fragment and asked for hand surgery consultation.  Past Medical History:  Diagnosis Date   Allergy    Chicken pox    COVID-19    Kidney stones    Migraines    Nephrolithiasis    Thyroid disease     Past Surgical History:  Procedure Laterality Date   gamekeepers thumb surgery     REPLACEMENT DISC ANTERIOR LUMBAR SPINE  2009   L5-S1    Family History  Problem Relation Age of Onset   Healthy Mother    Nephrolithiasis Father    Healthy Sister    Healthy Brother    Colon cancer Neg Hx    Esophageal cancer Neg Hx    Rectal cancer Neg Hx    Stomach cancer Neg Hx     Social History:  reports that he quit smoking about 7 years ago. His smoking use included cigarettes. He has a 15.00 pack-year smoking history. He has never used smokeless tobacco. He reports current alcohol use. He reports that he does not currently use drugs.  Allergies:  Allergies  Allergen Reactions   Penicillins     Medications: I have reviewed the patient's current medications.  No results found for this or any previous visit (from the past 48 hour(s)).  DG Hand Complete Left  Result Date: 10/14/2022 CLINICAL DATA:  Cut left index finger on an engine fan at work this morning. EXAM: LEFT HAND - COMPLETE 3+ VIEW COMPARISON:  None Available. FINDINGS: Acute fracture along the ulnar base of the second proximal phalanx with associated soft tissue injury. Fracture fragments extend near the skin surface. No additional fracture. No dislocation. Old healed fracture of the ulnar base of the first proximal phalanx status post cerclage wire fixation. Joint spaces are preserved. Bone mineralization is normal.  IMPRESSION: 1. Acute open fracture of the second proximal phalanx with associated soft tissue injury. Electronically Signed   By: Obie Dredge M.D.   On: 10/14/2022 13:40    Review of Systems  HENT:  Negative for ear discharge, ear pain, hearing loss and tinnitus.   Eyes:  Negative for photophobia and pain.  Respiratory:  Negative for cough and shortness of breath.   Cardiovascular:  Negative for chest pain.  Gastrointestinal:  Negative for abdominal pain, nausea and vomiting.  Genitourinary:  Negative for dysuria, flank pain, frequency and urgency.  Musculoskeletal:  Positive for arthralgias (Left hand). Negative for back pain, myalgias and neck pain.  Neurological:  Negative for dizziness and headaches.  Hematological:  Does not bruise/bleed easily.  Psychiatric/Behavioral:  The patient is not nervous/anxious.    Blood pressure 110/69, pulse 70, temperature 98.3 F (36.8 C), temperature source Oral, resp. rate 16, height 5\' 8"  (1.727 m), weight 80.7 kg, SpO2 100 %. Physical Exam Constitutional:      General: He is not in acute distress.    Appearance: He is well-developed. He is not diaphoretic.  HENT:     Head: Normocephalic and atraumatic.  Eyes:     General: No scleral icterus.       Right eye: No discharge.        Left eye: No discharge.     Conjunctiva/sclera: Conjunctivae  normal.  Cardiovascular:     Rate and Rhythm: Normal rate and regular rhythm.  Pulmonary:     Effort: Pulmonary effort is normal. No respiratory distress.  Musculoskeletal:     Cervical back: Normal range of motion.     Comments: Left shoulder, elbow, wrist, digits- Multiple lacerations involving the index, long, and ring fingers, mod TTP, no instability, no blocks to motion  Sens  Ax/R/M/U intact  Mot   Ax/ R/ PIN/ M/ AIN/ U intact  Rad 2+  Skin:    General: Skin is warm and dry.  Neurological:     Mental Status: He is alert.  Psychiatric:        Mood and Affect: Mood normal.        Behavior:  Behavior normal.     Assessment/Plan: Left hand lacerations -- Was able to hold bone fragment reduced so wound could be sutured. Splint and d/c on Keflex. F/u with Dr. Kerry Fort late this week or early next.    Freeman Caldron, PA-C Orthopedic Surgery [phone] 10/14/2022, 4:06 PM

## 2022-10-14 NOTE — ED Provider Notes (Signed)
Procedures  .Marland KitchenLaceration Repair  Date/Time: 10/14/2022 2:53 PM  Performed by: Mannie Stabile, PA-C Authorized by: Mannie Stabile, PA-C   Consent:    Consent obtained:  Verbal   Consent given by:  Patient   Risks, benefits, and alternatives were discussed: yes     Risks discussed:  Infection, poor cosmetic result, poor wound healing, tendon damage, retained foreign body and pain   Alternatives discussed:  No treatment and delayed treatment Universal protocol:    Procedure explained and questions answered to patient or proxy's satisfaction: yes     Relevant documents present and verified: yes     Test results available: yes     Imaging studies available: yes     Required blood products, implants, devices, and special equipment available: yes     Site/side marked: yes     Immediately prior to procedure, a time out was called: yes     Patient identity confirmed:  Verbally with patient Anesthesia:    Anesthesia method:  Local infiltration   Local anesthetic:  Lidocaine 1% w/o epi Laceration details:    Location:  Finger   Finger location:  L long finger   Length (cm):  2   Depth (mm):  3 Pre-procedure details:    Preparation:  Patient was prepped and draped in usual sterile fashion and imaging obtained to evaluate for foreign bodies Exploration:    Limited defect created (wound extended): no     Hemostasis achieved with:  Cautery   Imaging obtained: x-ray     Imaging outcome: foreign body not noted     Wound exploration: wound explored through full range of motion     Wound extent: no tendon damage     Contaminated: no   Treatment:    Area cleansed with:  Saline   Amount of cleaning:  Standard   Irrigation solution:  Sterile saline   Irrigation volume:  100   Irrigation method:  Syringe   Visualized foreign bodies/material removed: no     Debridement:  None   Undermining:  None   Scar revision: no   Skin repair:    Repair method:  Sutures   Suture size:   4-0   Suture material:  Prolene   Suture technique:  Simple interrupted   Number of sutures:  4 Approximation:    Approximation:  Close Repair type:    Repair type:  Simple Post-procedure details:    Dressing:  Splint for protection   Procedure completion:  Tolerated well, no immediate complications .Marland KitchenLaceration Repair  Date/Time: 10/14/2022 3:30 PM  Performed by: Mannie Stabile, PA-C Authorized by: Mannie Stabile, PA-C   Consent:    Consent obtained:  Verbal   Consent given by:  Patient   Risks, benefits, and alternatives were discussed: yes     Risks discussed:  Poor cosmetic result, poor wound healing and pain   Alternatives discussed:  No treatment Universal protocol:    Procedure explained and questions answered to patient or proxy's satisfaction: yes     Relevant documents present and verified: yes     Test results available: yes     Imaging studies available: yes     Required blood products, implants, devices, and special equipment available: yes     Site/side marked: yes     Immediately prior to procedure, a time out was called: yes     Patient identity confirmed:  Verbally with patient Anesthesia:    Anesthesia method:  Local infiltration  Local anesthetic:  Lidocaine 1% w/o epi Laceration details:    Location:  Finger   Finger location:  L ring finger   Length (cm):  2   Depth (mm):  2 Pre-procedure details:    Preparation:  Patient was prepped and draped in usual sterile fashion Exploration:    Limited defect created (wound extended): no     Hemostasis achieved with:  Direct pressure   Imaging obtained: x-ray     Imaging outcome: foreign body not noted     Wound exploration: wound explored through full range of motion and entire depth of wound visualized     Wound extent: no tendon damage     Contaminated: no   Treatment:    Area cleansed with:  Saline   Amount of cleaning:  Standard   Irrigation solution:  Sterile saline   Irrigation volume:   100   Irrigation method:  Syringe   Visualized foreign bodies/material removed: no     Debridement:  None   Scar revision: no   Skin repair:    Repair method:  Sutures   Suture size:  4-0   Suture material:  Prolene   Suture technique:  Simple interrupted   Number of sutures:  2 Approximation:    Approximation:  Close Repair type:    Repair type:  Simple Post-procedure details:    Dressing:  Splint for protection .Marland KitchenLaceration Repair  Date/Time: 10/14/2022 3:39 PM  Performed by: Mannie Stabile, PA-C Authorized by: Mannie Stabile, PA-C   Consent:    Consent obtained:  Verbal   Consent given by:  Patient   Risks, benefits, and alternatives were discussed: yes     Risks discussed:  Infection and poor cosmetic result   Alternatives discussed:  No treatment Universal protocol:    Procedure explained and questions answered to patient or proxy's satisfaction: yes     Relevant documents present and verified: yes     Test results available: yes     Imaging studies available: yes     Required blood products, implants, devices, and special equipment available: yes     Site/side marked: yes     Immediately prior to procedure, a time out was called: yes     Patient identity confirmed:  Verbally with patient Anesthesia:    Anesthesia method:  Local infiltration   Local anesthetic:  Lidocaine 1% w/o epi Laceration details:    Location:  Finger   Finger location:  L long finger   Length (cm):  3   Depth (mm):  3 Pre-procedure details:    Preparation:  Patient was prepped and draped in usual sterile fashion and imaging obtained to evaluate for foreign bodies Exploration:    Limited defect created (wound extended): no     Hemostasis achieved with:  Direct pressure   Imaging obtained: x-ray     Imaging outcome: foreign body not noted     Wound exploration: entire depth of wound visualized     Wound extent: no foreign body and no tendon damage     Contaminated: no    Treatment:    Area cleansed with:  Saline   Amount of cleaning:  Standard   Irrigation solution:  Sterile saline   Irrigation volume:  100   Irrigation method:  Syringe   Visualized foreign bodies/material removed: no     Debridement:  None   Undermining:  None   Scar revision: no   Skin repair:    Repair method:  Sutures   Suture size:  4-0   Suture material:  Prolene   Suture technique:  Simple interrupted   Number of sutures:  4 Approximation:    Approximation:  Close Repair type:    Repair type:  Simple Post-procedure details:    Dressing:  Splint for protection   Procedure completion:  Tolerated well, no immediate complications        Curtis Cox 10/14/22 1540    Linwood Dibbles, MD 10/15/22 [phone]

## 2022-10-14 NOTE — ED Triage Notes (Signed)
Pt arrived pov with wife. CC: cut L index finger in fan at work at 1045 this morning, pain 6/10 with movement. Pt A&Ox4.

## 2022-10-17 ENCOUNTER — Other Ambulatory Visit: Payer: Self-pay | Admitting: Orthopedic Surgery

## 2022-10-17 DIAGNOSIS — M259 Joint disorder, unspecified: Secondary | ICD-10-CM

## 2022-10-22 ENCOUNTER — Encounter: Payer: Self-pay | Admitting: Orthopedic Surgery

## 2022-10-28 ENCOUNTER — Other Ambulatory Visit: Payer: Self-pay | Admitting: Family Medicine

## 2022-11-11 ENCOUNTER — Ambulatory Visit
Admission: RE | Admit: 2022-11-11 | Discharge: 2022-11-11 | Disposition: A | Discharge status: Home / Self Care | Payer: 59 | Source: Ambulatory Visit | Attending: Orthopedic Surgery | Admitting: Orthopedic Surgery

## 2022-11-11 DIAGNOSIS — M259 Joint disorder, unspecified: Secondary | ICD-10-CM

## 2023-09-02 ENCOUNTER — Encounter: Payer: Self-pay | Admitting: Family Medicine

## 2023-09-02 ENCOUNTER — Ambulatory Visit (INDEPENDENT_AMBULATORY_CARE_PROVIDER_SITE_OTHER): Payer: 59 | Admitting: Family Medicine

## 2023-09-02 VITALS — BP 100/70 | HR 59 | Temp 97.2°F | Ht 68.0 in | Wt 176.6 lb

## 2023-09-02 DIAGNOSIS — E039 Hypothyroidism, unspecified: Secondary | ICD-10-CM

## 2023-09-02 DIAGNOSIS — E663 Overweight: Secondary | ICD-10-CM | POA: Diagnosis not present

## 2023-09-02 DIAGNOSIS — Z87891 Personal history of nicotine dependence: Secondary | ICD-10-CM

## 2023-09-02 DIAGNOSIS — E785 Hyperlipidemia, unspecified: Secondary | ICD-10-CM | POA: Diagnosis not present

## 2023-09-02 DIAGNOSIS — Z131 Encounter for screening for diabetes mellitus: Secondary | ICD-10-CM | POA: Diagnosis not present

## 2023-09-02 DIAGNOSIS — Z0000 Encounter for general adult medical examination without abnormal findings: Secondary | ICD-10-CM

## 2023-09-02 LAB — LIPID PANEL
Cholesterol: 210 mg/dL — ABNORMAL HIGH (ref 0–200)
HDL: 38.2 mg/dL — ABNORMAL LOW (ref >39.00)
LDL Cholesterol: 108 mg/dL — ABNORMAL HIGH (ref 0–99)
NonHDL: 171.9
Total CHOL/HDL Ratio: 6
Triglycerides: 318 mg/dL — ABNORMAL HIGH (ref 0.0–149.0)
VLDL: 63.6 mg/dL — ABNORMAL HIGH (ref 0.0–40.0)

## 2023-09-02 LAB — CBC WITH DIFFERENTIAL/PLATELET
Basophils Absolute: 0.1 10*3/uL (ref 0.0–0.1)
Basophils Relative: 1 % (ref 0.0–3.0)
Eosinophils Absolute: 0.1 10*3/uL (ref 0.0–0.7)
Eosinophils Relative: 1.6 % (ref 0.0–5.0)
HCT: 46 % (ref 39.0–52.0)
Hemoglobin: 15.6 g/dL (ref 13.0–17.0)
Lymphocytes Relative: 29.2 % (ref 12.0–46.0)
Lymphs Abs: 1.6 10*3/uL (ref 0.7–4.0)
MCHC: 34 g/dL (ref 30.0–36.0)
MCV: 87.4 fl (ref 78.0–100.0)
Monocytes Absolute: 0.4 10*3/uL (ref 0.1–1.0)
Monocytes Relative: 7.7 % (ref 3.0–12.0)
Neutro Abs: 3.4 10*3/uL (ref 1.4–7.7)
Neutrophils Relative %: 60.5 % (ref 43.0–77.0)
Platelets: 372 10*3/uL (ref 150.0–400.0)
RBC: 5.26 Mil/uL (ref 4.22–5.81)
RDW: 13.8 % (ref 11.5–15.5)
WBC: 5.5 10*3/uL (ref 4.0–10.5)

## 2023-09-02 LAB — COMPREHENSIVE METABOLIC PANEL WITH GFR
ALT: 21 U/L (ref 0–53)
AST: 23 U/L (ref 0–37)
Albumin: 5 g/dL (ref 3.5–5.2)
Alkaline Phosphatase: 51 U/L (ref 39–117)
BUN: 16 mg/dL (ref 6–23)
CO2: 29 meq/L (ref 19–32)
Calcium: 9.6 mg/dL (ref 8.4–10.5)
Chloride: 104 meq/L (ref 96–112)
Creatinine, Ser: 1.09 mg/dL (ref 0.40–1.50)
GFR: 80.21 mL/min (ref >60.00)
Glucose, Bld: 81 mg/dL (ref 70–99)
Potassium: 4.1 meq/L (ref 3.5–5.1)
Sodium: 140 meq/L (ref 135–145)
Total Bilirubin: 0.8 mg/dL (ref 0.2–1.2)
Total Protein: 7.6 g/dL (ref 6.0–8.3)

## 2023-09-02 LAB — URINALYSIS, ROUTINE W REFLEX MICROSCOPIC
Bilirubin Urine: NEGATIVE
Hgb urine dipstick: NEGATIVE
Ketones, ur: NEGATIVE
Leukocytes,Ua: NEGATIVE
Nitrite: NEGATIVE
RBC / HPF: NONE SEEN (ref 0–?)
Specific Gravity, Urine: 1.025 (ref 1.000–1.030)
Total Protein, Urine: NEGATIVE
Urine Glucose: NEGATIVE
Urobilinogen, UA: 0.2 (ref 0.0–1.0)
WBC, UA: NONE SEEN (ref 0–?)
pH: 6 (ref 5.0–8.0)

## 2023-09-02 LAB — TSH: TSH: 0.05 u[IU]/mL — ABNORMAL LOW (ref 0.35–5.50)

## 2023-09-02 LAB — HEMOGLOBIN A1C: Hgb A1c MFr Bld: 5.3 % (ref 4.6–6.5)

## 2023-09-02 NOTE — Progress Notes (Signed)
 Phone: [phone]    Subjective:  Patient presents today for their annual physical. Chief complaint-noted.   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    Priority: Medium    Hyperlipidemia, unspecified 08/10/2019    Priority: Medium    Hypothyroidism 05/08/2016    Priority: Medium    Nephrolithiasis     Priority: Low   Plantar fasciitis 05/07/2011    Priority: Low   Past Surgical History:  Procedure Laterality Date   FRACTURE SURGERY     gamekeepers thumb surgery     REPLACEMENT DISC ANTERIOR LUMBAR SPINE  2009   L5-S1   SPINE SURGERY      Family History  Problem Relation Age of Onset   COPD Mother    Nephrolithiasis Father    CAD Father        stent around age 49   Healthy Sister    Healthy Brother    Colon cancer Neg Hx    Esophageal cancer Neg Hx    Rectal cancer Neg Hx    Stomach cancer Neg Hx     Medications- reviewed and updated Current Outpatient Medications  Medication Sig Dispense Refill   celecoxib (CELEBREX) 200 MG capsule Take 200 mg by mouth 2 (two) times daily.     cetirizine (ZYRTEC) 10 MG tablet Take 10 mg by mouth daily.     Cholecalciferol (VITAMIN D3 PO) Take by mouth.     fexofenadine (ALLEGRA) 180 MG tablet Take 180 mg by mouth daily.     levothyroxine (SYNTHROID) 150 MCG tablet TAKE 1 TABLET BY MOUTH EVERY DAY 90 tablet 3   Misc Natural Products (FOCUSED MIND PO) Take by mouth.     Multiple Vitamin (MULTIVITAMIN) tablet Take 1 tablet by mouth daily.     Omega-3 Fatty Acids (FISH OIL PO) Take by mouth.     No current facility-administered  medications for this visit.    Allergies-reviewed and updated Allergies  Allergen Reactions   Penicillins     Social History   Social History Narrative   Divorced 2020. 1 son 43 in 2023- [employer].       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 HS education   Prior in [place].       Objective:  BP 100/70   Pulse (!) 59   Temp (!) 97.2 F (36.2 C)   Ht 5\' 8"  (1.727 m)   Wt 176 lb 9.6 oz (80.1 kg)   SpO2 98%   BMI 26.85 kg/m  Gen: NAD, resting comfortably HEENT: Mucous membranes are moist. Oropharynx normal Neck: no thyromegaly CV: RRR no murmurs rubs or gallops Lungs: CTAB no crackles, wheeze, rhonchi Abdomen: soft/nontender/nondistended/normal bowel sounds. No rebound or guarding.  Ext: no edema Skin: warm, dry Neuro: grossly normal, moves all extremities, PERRLA Declines genitourinary and rectal exams    Assessment and Plan:  48 y.o. male presenting for annual physical.  Health Maintenance counseling: 1. Anticipatory guidance: Patient counseled regarding regular dental exams - advised q6 months, eye exams -  wearing readers- will get visit in june,  avoiding smoking and second hand smoke , limiting alcohol to 2 beverages per day- less than 1 a week, no illicit drugs.   2. Risk factor reduction:  Advised patient of need for regular exercise and diet rich and fruits and vegetables to reduce risk of heart attack and stroke.  Exercise- not regularly- encouraged to find an activity that works for him but hard with orthopedic issues.  Diet/weight management-down 10 lbs from last physical- more conscious of what he's eating.  Wt Readings from Last 3 Encounters:  09/02/23 176 lb 9.6 oz (80.1 kg)  10/14/22 178 lb (80.7 kg)  05/20/22 180 lb (81.6 kg)  3. Immunizations/screenings/ancillary studies- holding off on COVID shots but otherwise up to date on vaccines. I don't see strong indication for Prevnar 20 Immunization History  Administered  Date(s) Administered   Influenza,inj,Quad PF,6+ Mos 03/19/2018   Influenza-Unspecified 03/15/2016   PFIZER(Purple Top)SARS-COV-2 Vaccination 08/18/2019, 09/12/2019   Tdap 05/08/2016  4. Prostate cancer screening- no family history, start at age 75   5. Colon cancer screening - 11/18/21 with 7 year repeat planned 6. Skin cancer screening/prevention- no recent dermatology visits- consider referral in next few years- wants to hold off. advised regular sunscreen use. Denies worrisome, changing, or new skin lesions.  7. Testicular cancer screening- advised monthly self exams  8. STD screening- patient opts out- only active with girlfriend - monogamous so opts out of STD  9. Smoking associated screening- former smoker- quit 2017 and 15 pack years- get urine and at age 9 get AAA   Status of chronic or acute concerns   #hypothyroidism S: He reports still compliant with levothyroxine 150 mcg Lab Results  Component Value Date   TSH 2.66 08/27/2021  A/P:hopefully stable- update tsh today. Continue current meds for now    #hyperlipidemia S: Medication:none. Ascvd risk has been below 3% Lab Results  Component Value Date   CHOL 228 (H) 08/27/2021   HDL 43.70 08/27/2021   LDLDIRECT 146.0 08/27/2021   TRIG 243.0 (H) 08/27/2021   CHOLHDL 5 08/27/2021   A/P: update lipids and recalculate risk- has had some weight loss so may have improved- also hoping triglyceride(s) better  #prior SOB after covid 19 a few years ago that persisted but improved years later. Echo largely reassuring other than aortic valve sclerosis- he reports no recent shortness of breath   # Orthopedic concerns-lumbar, cervical, SI joint pain-follows with Dr. Campbell Lerner and injections with their office in 2025-[facility] -significant hand injury in 2024- repaired by Dr Amanda Pea- cut by coolant fan  Recommended follow up: Return in about 1 year (around 09/01/2024) for physical or sooner if needed.Schedule b4 you leave.  Lab/Order  associations: fasting   ICD-10-CM   1. Preventative health care  Z00.00     2. Screening for diabetes mellitus  Z13.1 Hemoglobin A1c    3. Overweight  E66.3 Hemoglobin A1c    4. Hypothyroidism, unspecified type  E03.9 TSH    5. Hyperlipidemia, unspecified hyperlipidemia type  E78.5 Comprehensive metabolic panel with GFR    CBC with Differential/Platelet    Lipid panel    TSH    6. Former smoker  Z87.891 Urinalysis, Routine w reflex microscopic     No orders of the defined types were placed in this encounter.  Return precautions advised.  Tana Conch, MD

## 2023-09-02 NOTE — Patient Instructions (Addendum)
 Please stop by lab before you go If you have mychart- we will send your results within 3 business days of [place] receiving them.  If you do not have mychart- we will call you about results within 5 business days of [place] receiving them.  *please also note that you will see labs on mychart as soon as they post. I will later go in and write notes on them- will say "notes from Dr. Durene Cal"   Goal exercise 150 minutes a week but have to work around your orthopedic issues  Update dental exam  Recommended follow up: Return in about 1 year (around 09/01/2024) for physical or sooner if needed.Schedule b4 you leave.

## 2023-09-03 ENCOUNTER — Encounter: Payer: Self-pay | Admitting: Family Medicine

## 2023-09-04 ENCOUNTER — Other Ambulatory Visit: Payer: Self-pay

## 2023-09-04 DIAGNOSIS — E039 Hypothyroidism, unspecified: Secondary | ICD-10-CM

## 2023-09-04 MED ORDER — LEVOTHYROXINE SODIUM 137 MCG PO TABS: 137.0000 ug | ORAL_TABLET | Freq: Every day | ORAL | 0 refills | Status: DC

## 2023-10-05 ENCOUNTER — Encounter: Payer: Self-pay | Admitting: Family Medicine

## 2023-10-14 ENCOUNTER — Other Ambulatory Visit

## 2023-11-03 NOTE — Telephone Encounter (Signed)
 FYI

## 2023-11-09 ENCOUNTER — Ambulatory Visit: Payer: Self-pay | Admitting: Family Medicine

## 2023-11-09 ENCOUNTER — Other Ambulatory Visit (INDEPENDENT_AMBULATORY_CARE_PROVIDER_SITE_OTHER)

## 2023-11-09 DIAGNOSIS — E039 Hypothyroidism, unspecified: Secondary | ICD-10-CM

## 2023-11-09 LAB — TSH: TSH: 0.1 u[IU]/mL — ABNORMAL LOW (ref 0.35–5.50)

## 2023-11-28 ENCOUNTER — Other Ambulatory Visit: Payer: Self-pay | Admitting: Family Medicine

## 2024-03-05 ENCOUNTER — Other Ambulatory Visit: Payer: Self-pay | Admitting: Family Medicine

## 2024-04-19 ENCOUNTER — Encounter: Payer: Self-pay | Admitting: Family Medicine

## 2024-06-17 ENCOUNTER — Other Ambulatory Visit: Payer: Self-pay | Admitting: Family Medicine
